# Patient Record
Sex: Female | Born: 1970 | Race: White | Hispanic: No | Marital: Single | Smoking: Never smoker
Health system: Southern US, Community
[De-identification: ages and names within clinical notes are randomized; demographics above are authoritative.]

## PROBLEM LIST (undated history)

## (undated) DIAGNOSIS — R079 Chest pain, unspecified: Secondary | ICD-10-CM

## (undated) DIAGNOSIS — M1990 Unspecified osteoarthritis, unspecified site: Secondary | ICD-10-CM

## (undated) DIAGNOSIS — D649 Anemia, unspecified: Secondary | ICD-10-CM

## (undated) DIAGNOSIS — R011 Cardiac murmur, unspecified: Secondary | ICD-10-CM

## (undated) HISTORY — PX: CHOLECYSTECTOMY: SHX55

## (undated) HISTORY — DX: Cardiac murmur, unspecified: R01.1

## (undated) HISTORY — DX: Chest pain, unspecified: R07.9

---

## 2008-11-10 ENCOUNTER — Emergency Department: Payer: Self-pay | Admitting: Emergency Medicine

## 2009-03-03 ENCOUNTER — Emergency Department: Payer: Self-pay | Admitting: Emergency Medicine

## 2010-03-12 ENCOUNTER — Observation Stay (HOSPITAL_COMMUNITY): Admission: EM | Admit: 2010-03-12 | Discharge: 2010-03-13 | Payer: Self-pay | Admitting: Emergency Medicine

## 2010-03-13 ENCOUNTER — Encounter (INDEPENDENT_AMBULATORY_CARE_PROVIDER_SITE_OTHER): Payer: Self-pay | Admitting: Emergency Medicine

## 2010-03-17 ENCOUNTER — Emergency Department (HOSPITAL_COMMUNITY): Admission: EM | Admit: 2010-03-17 | Discharge: 2010-03-18 | Payer: Self-pay | Admitting: Emergency Medicine

## 2010-05-16 ENCOUNTER — Emergency Department: Payer: Self-pay | Admitting: Emergency Medicine

## 2010-10-27 LAB — POCT CARDIAC MARKERS
Myoglobin, poc: 44.5 ng/mL (ref 12–200)
Troponin i, poc: <0.05 ng/mL (ref 0.00–0.09)

## 2010-10-28 LAB — HEPATIC FUNCTION PANEL
AST: 36 U/L (ref 0–37)
Albumin: 3.5 g/dL (ref 3.5–5.2)
Total Bilirubin: 0.4 mg/dL (ref 0.3–1.2)

## 2010-10-28 LAB — POCT CARDIAC MARKERS: Troponin i, poc: <0.05 ng/mL (ref 0.00–0.09)

## 2010-10-28 LAB — DIFFERENTIAL
Basophils Absolute: 0.1 10*3/uL (ref 0.0–0.1)
Basophils Relative: 1 % (ref 0–1)
Eosinophils Absolute: 0.6 10*3/uL (ref 0.0–0.7)
Lymphocytes Relative: 21 % (ref 12–46)
Lymphs Abs: 1.8 10*3/uL (ref 0.7–4.0)
Monocytes Relative: 8 % (ref 3–12)
Neutrophils Relative %: 63 % (ref 43–77)

## 2010-10-28 LAB — POCT I-STAT, CHEM 8
Hemoglobin: 13.6 g/dL (ref 12.0–15.0)
TCO2: 24 mmol/L (ref 0–100)

## 2010-10-28 LAB — RAPID URINE DRUG SCREEN, HOSP PERFORMED
Cocaine: NOT DETECTED
Opiates: NOT DETECTED
Tetrahydrocannabinol: NOT DETECTED

## 2010-10-28 LAB — D-DIMER, QUANTITATIVE: D-Dimer, Quant: 0.4 ug/mL-FEU (ref 0.00–0.48)

## 2010-10-28 LAB — CBC
MCH: 31.9 pg (ref 26.0–34.0)
MCHC: 33.4 g/dL (ref 30.0–36.0)
MCV: 95.5 fL (ref 78.0–100.0)
Platelets: 325 10*3/uL (ref 150–400)
RBC: 3.87 MIL/uL (ref 3.87–5.11)

## 2012-03-08 ENCOUNTER — Encounter (HOSPITAL_COMMUNITY): Payer: Self-pay

## 2012-03-08 ENCOUNTER — Emergency Department (HOSPITAL_COMMUNITY)
Admission: EM | Admit: 2012-03-08 | Discharge: 2012-03-08 | Disposition: A | Discharge status: Home / Self Care | Payer: Self-pay | Attending: Emergency Medicine | Admitting: Emergency Medicine

## 2012-03-08 DIAGNOSIS — M545 Low back pain, unspecified: Secondary | ICD-10-CM | POA: Insufficient documentation

## 2012-03-08 DIAGNOSIS — X58XXXA Exposure to other specified factors, initial encounter: Secondary | ICD-10-CM | POA: Insufficient documentation

## 2012-03-08 DIAGNOSIS — S335XXA Sprain of ligaments of lumbar spine, initial encounter: Secondary | ICD-10-CM | POA: Insufficient documentation

## 2012-03-08 DIAGNOSIS — Y92009 Unspecified place in unspecified non-institutional (private) residence as the place of occurrence of the external cause: Secondary | ICD-10-CM | POA: Insufficient documentation

## 2012-03-08 DIAGNOSIS — S39012A Strain of muscle, fascia and tendon of lower back, initial encounter: Secondary | ICD-10-CM

## 2012-03-08 MED ORDER — OXYCODONE-ACETAMINOPHEN 5-325 MG PO TABS
1.0000 | ORAL_TABLET | Freq: Once | ORAL | Status: AC
  Administered 2012-03-08: 1 via ORAL
  Filled 2012-03-08: qty 1

## 2012-03-08 MED ORDER — IBUPROFEN 800 MG PO TABS: 800.0000 mg | ORAL_TABLET | Freq: Three times a day (TID) | ORAL | Status: AC | PRN

## 2012-03-08 MED ORDER — OXYCODONE-ACETAMINOPHEN 5-325 MG PO TABS: 1.0000 | ORAL_TABLET | Freq: Four times a day (QID) | ORAL | Status: AC | PRN

## 2012-03-08 MED ORDER — CYCLOBENZAPRINE HCL 5 MG PO TABS: 5.0000 mg | ORAL_TABLET | Freq: Three times a day (TID) | ORAL | Status: DC | PRN

## 2012-03-08 MED ORDER — CYCLOBENZAPRINE HCL 10 MG PO TABS
10.0000 mg | ORAL_TABLET | Freq: Once | ORAL | Status: AC
  Administered 2012-03-08: 10 mg via ORAL
  Filled 2012-03-08: qty 1

## 2012-03-08 MED ORDER — IBUPROFEN 800 MG PO TABS
800.0000 mg | ORAL_TABLET | Freq: Once | ORAL | Status: AC
  Administered 2012-03-08: 800 mg via ORAL
  Filled 2012-03-08: qty 1

## 2012-03-08 NOTE — ED Provider Notes (Signed)
History     CSN: [id number]  Arrival date & time 03/08/12  1830   First MD Initiated Contact with Patient 03/08/12 1835      Chief Complaint  Patient presents with  . Back Pain    (Consider location/radiation/quality/duration/timing/severity/associated sxs/prior treatment) HPI Comments: Mallory Neal presents with acute  low back pain which has which started when she woke this morning.   Patient denies any new injury specifically,  But spent yesterday cleaning her house and doing laundry so was fairly active.  There is no radiation of pain.  There has been no weakness or numbness in the lower extremities and no urinary or bowel retention or incontinence.  Patient does not have a history of cancer or IVDU.  She has tried a generic tylenol labeled for "back and body" pain which has not been helpful.     The history is provided by the patient.    History reviewed. No pertinent past medical history.  Past Surgical History  Procedure Date  . Cholecystectomy     No family history on file.  History  Substance Use Topics  . Smoking status: Never Smoker   . Smokeless tobacco: Not on file  . Alcohol Use: Yes     occ    OB History    Grav Para Term Preterm Abortions TAB SAB Ect Mult Living                  Review of Systems  Constitutional: Negative for fever.  Respiratory: Negative for shortness of breath.   Cardiovascular: Negative for chest pain and leg swelling.  Gastrointestinal: Negative for abdominal pain, constipation and abdominal distention.  Genitourinary: Negative for dysuria, urgency, frequency, flank pain and difficulty urinating.  Musculoskeletal: Positive for back pain. Negative for joint swelling and gait problem.  Skin: Negative for rash.  Neurological: Negative for weakness and numbness.    Allergies  Review of patient's allergies indicates no known allergies.  Home Medications   Current Outpatient Rx  Name Route Sig Dispense Refill  .  CYCLOBENZAPRINE HCL 5 MG PO TABS Oral Take 1 tablet (5 mg total) by mouth 3 (three) times daily as needed for muscle spasms. 15 tablet 0  . IBUPROFEN 800 MG PO TABS Oral Take 1 tablet (800 mg total) by mouth every 8 (eight) hours as needed for pain. 15 tablet 0  . OXYCODONE-ACETAMINOPHEN 5-325 MG PO TABS Oral Take 1 tablet by mouth every 6 (six) hours as needed for pain. 20 tablet 0    BP 132/79  Pulse 82  Temp 98.3 F (36.8 C) (Oral)  Resp 20  Ht 5\' 6"  (1.676 m)  Wt 230 lb (104.327 kg)  BMI 37.12 kg/m2  SpO2 100%  LMP 02/11/2012  Physical Exam  Nursing note and vitals reviewed. Constitutional: She appears well-developed and well-nourished.  HENT:  Head: Normocephalic.  Eyes: Conjunctivae are normal.  Neck: Normal range of motion. Neck supple.  Cardiovascular: Normal rate and intact distal pulses.        Pedal pulses normal.  Pulmonary/Chest: Effort normal.  Abdominal: Soft. Bowel sounds are normal. She exhibits no distension and no mass.  Musculoskeletal: Normal range of motion. She exhibits no edema.       Lumbar back: She exhibits tenderness. She exhibits no swelling, no edema and no spasm.  Neurological: She is alert. She has normal strength. She displays no atrophy and no tremor. No sensory deficit. Gait normal.  Reflex Scores:      Patellar  reflexes are 2+ on the right side and 2+ on the left side.      Achilles reflexes are 2+ on the right side and 2+ on the left side.      No strength deficit noted in hip and knee flexor and extensor muscle groups.  Ankle flexion and extension intact.  Skin: Skin is warm and dry.  Psychiatric: She has a normal mood and affect.    ED Course  Procedures (including critical care time)  Labs Reviewed - No data to display No results found.   1. Lumbar strain       MDM  No neuro deficit on exam or by history to suggest emergent or surgical presentation.  Pt prescribed ibuprofen,  Flexeril and percocet.  Symptoms that should prompt  emergent recheck discussed.          Burgess Amor, [place] 03/08/12 1912

## 2012-03-08 NOTE — ED Notes (Signed)
Pt reports waking this am w/ severe low back pain, denies any n/v/d or fever, no urinary s/s, has taken 4 back and body with no relief.

## 2012-03-08 NOTE — ED Provider Notes (Signed)
Medical screening examination/treatment/procedure(s) were performed by non-physician practitioner and as supervising physician I was immediately available for consultation/collaboration.   Carleene Cooper III, MD 03/08/12 [phone]

## 2012-07-04 ENCOUNTER — Emergency Department (HOSPITAL_COMMUNITY)
Admission: EM | Admit: 2012-07-04 | Discharge: 2012-07-05 | Disposition: A | Discharge status: Home / Self Care | Payer: Self-pay | Attending: Emergency Medicine | Admitting: Emergency Medicine

## 2012-07-04 ENCOUNTER — Encounter (HOSPITAL_COMMUNITY): Payer: Self-pay | Admitting: *Deleted

## 2012-07-04 DIAGNOSIS — M549 Dorsalgia, unspecified: Secondary | ICD-10-CM

## 2012-07-04 DIAGNOSIS — M545 Low back pain, unspecified: Secondary | ICD-10-CM | POA: Insufficient documentation

## 2012-07-04 DIAGNOSIS — F1010 Alcohol abuse, uncomplicated: Secondary | ICD-10-CM | POA: Insufficient documentation

## 2012-07-04 LAB — URINALYSIS, ROUTINE W REFLEX MICROSCOPIC
Bilirubin Urine: NEGATIVE
Glucose, UA: NEGATIVE mg/dL
Hgb urine dipstick: NEGATIVE
Ketones, ur: NEGATIVE mg/dL
Nitrite: NEGATIVE
Protein, ur: NEGATIVE mg/dL
Specific Gravity, Urine: <1.005 — ABNORMAL LOW (ref 1.005–1.030)

## 2012-07-04 MED ORDER — CYCLOBENZAPRINE HCL 10 MG PO TABS
10.0000 mg | ORAL_TABLET | Freq: Once | ORAL | Status: AC
  Administered 2012-07-04: 10 mg via ORAL
  Filled 2012-07-04: qty 1

## 2012-07-04 MED ORDER — IBUPROFEN 800 MG PO TABS
800.0000 mg | ORAL_TABLET | Freq: Once | ORAL | Status: AC
  Administered 2012-07-04: 800 mg via ORAL
  Filled 2012-07-04: qty 1

## 2012-07-04 MED ORDER — ONDANSETRON HCL 4 MG/2ML IJ SOLN
4.0000 mg | Freq: Once | INTRAMUSCULAR | Status: AC
  Administered 2012-07-04: 4 mg via INTRAVENOUS
  Filled 2012-07-04: qty 2

## 2012-07-04 MED ORDER — HYDROMORPHONE HCL PF 1 MG/ML IJ SOLN
1.0000 mg | Freq: Once | INTRAMUSCULAR | Status: AC
  Administered 2012-07-04: 1 mg via INTRAVENOUS
  Filled 2012-07-04: qty 1

## 2012-07-04 NOTE — ED Provider Notes (Signed)
History   This chart was scribed for Sunnie Nielsen, MD by Gerlean Ren, ED Scribe. This patient was seen in room [facility]19[facility] and the patient's care was started at 11:33 PM    CSN: [id number]  Arrival date & time 07/04/12  2102   First MD Initiated Contact with Patient 07/04/12 2259      Chief Complaint  Patient presents with  . Flank Pain     The history is provided by the patient. No language interpreter was used.   Mallory Neal is a 41 y.o. female who presents to the Emergency Department complaining of constant, sharp, non-radiating moderate lower back pain with gradual onset at 3:00 PM that has been gradually worsening since.  Pt denies any unusual lifting, bending, or twisting tonight that may have caused pain. Pt denies abdominal pain, incontinence, weakness, numbness, fever, or dysuria.  Pt has no h/o chronic medical conditions.  Pt denies tobacco use but reports occasional alcohol use.   History reviewed. No pertinent past medical history.  Past Surgical History  Procedure Date  . Cholecystectomy     History reviewed. No pertinent family history.  History  Substance Use Topics  . Smoking status: Never Smoker   . Smokeless tobacco: Not on file  . Alcohol Use: Yes     Comment: occ    No OB history provided.   Review of Systems  Constitutional: Negative for fever.  Respiratory: Negative for shortness of breath.   Cardiovascular: Negative for chest pain.  Gastrointestinal: Negative for nausea, vomiting and abdominal pain.  Genitourinary: Negative for dysuria.  Musculoskeletal: Positive for back pain. Negative for gait problem.  Skin: Negative for rash.  Neurological: Negative for weakness and numbness.  Psychiatric/Behavioral: Negative for confusion.    Allergies  Codeine and Hydrocodone  Home Medications  No current outpatient prescriptions on file.  BP 142/89  Pulse 106  Temp 97.5 F (36.4 C) (Oral)  Resp 20  Ht 5\' 6"  (1.676 m)  Wt 249 lb (112.946 kg)   BMI 40.19 kg/m2  SpO2 98%  LMP 06/23/2012  Physical Exam  Nursing note and vitals reviewed. Constitutional: She is oriented to person, place, and time. She appears well-developed and well-nourished.  HENT:  Head: Normocephalic and atraumatic.  Eyes: Conjunctivae normal and EOM are normal. Pupils are equal, round, and reactive to light.  Neck: Normal range of motion. Neck supple.  Cardiovascular: Normal rate, regular rhythm and normal heart sounds.   Pulmonary/Chest: Effort normal and breath sounds normal.  Abdominal: Soft. Bowel sounds are normal.  Musculoskeletal: Normal range of motion.       Tender over left lower lumbar, no deformity, no rash. No lower extremity deficits: equal sensorium to light touch, strengths and DTRs.  Gait intact.  Neurological: She is alert and oriented to person, place, and time.  Skin: Skin is warm and dry.  Psychiatric: She has a normal mood and affect.    ED Course  Procedures (including critical care time) DIAGNOSTIC STUDIES: Oxygen Saturation is 98% on room air, normal by my interpretation.    COORDINATION OF CARE: 11:40 PM- Patient informed of clinical course, understands medical decision-making process, and agrees with plan.    Results for orders placed during the hospital encounter of 07/04/12  URINALYSIS, ROUTINE W REFLEX MICROSCOPIC      Component Value Range   Color, Urine YELLOW  YELLOW   APPearance CLEAR  CLEAR   Specific Gravity, Urine <1.005 (*) 1.005 - 1.030   pH 5.5  5.0 -  8.0   Glucose, UA NEGATIVE  NEGATIVE mg/dL   Hgb urine dipstick NEGATIVE  NEGATIVE   Bilirubin Urine NEGATIVE  NEGATIVE   Ketones, ur NEGATIVE  NEGATIVE mg/dL   Protein, ur NEGATIVE  NEGATIVE mg/dL   Urobilinogen, UA 0.2  0.0 - 1.0 mg/dL   Nitrite NEGATIVE  NEGATIVE   Leukocytes, UA NEGATIVE  NEGATIVE   No direct trauma or indication for imaging based on presentation and exam. Motrin Flexeril provided.  IV Dilaudid - patient states she is able to take  Percocet, does not have a serious hydrocodone allergy.  on recheck pain improved. Back pain precautions provided and verbalizes understood. Plan discharge home with prescriptions and close primary care followup.  MDM  Back pain likely musculoskeletal in origin. No direct trauma. Normal neuro exam without red flags. UA reviewed as above. IV narcotics and pain medications provided. Vital signs nursing notes reviewed.   I personally performed the services described in this documentation, which was scribed in my presence. The recorded information has been reviewed and is accurate.         Sunnie Nielsen, MD 07/05/12 [phone]

## 2012-07-04 NOTE — ED Notes (Signed)
Pain lt upper back, no known injury, cough, Increased pain with movement.

## 2012-07-05 MED ORDER — CYCLOBENZAPRINE HCL 10 MG PO TABS: 10.0000 mg | ORAL_TABLET | Freq: Three times a day (TID) | ORAL | Status: DC | PRN

## 2012-07-05 MED ORDER — IBUPROFEN 800 MG PO TABS: 800.0000 mg | ORAL_TABLET | Freq: Three times a day (TID) | ORAL | Status: DC

## 2012-07-05 MED ORDER — TRAMADOL HCL 50 MG PO TABS: 50.0000 mg | ORAL_TABLET | Freq: Four times a day (QID) | ORAL | Status: DC | PRN

## 2012-07-05 NOTE — ED Notes (Signed)
Applied ice pack to left side of back.

## 2012-12-14 ENCOUNTER — Emergency Department (HOSPITAL_COMMUNITY)
Admission: EM | Admit: 2012-12-14 | Discharge: 2012-12-14 | Disposition: A | Discharge status: Home / Self Care | Payer: Self-pay | Attending: Emergency Medicine | Admitting: Emergency Medicine

## 2012-12-14 ENCOUNTER — Encounter (HOSPITAL_COMMUNITY): Payer: Self-pay

## 2012-12-14 DIAGNOSIS — Z79899 Other long term (current) drug therapy: Secondary | ICD-10-CM | POA: Insufficient documentation

## 2012-12-14 DIAGNOSIS — IMO0001 Reserved for inherently not codable concepts without codable children: Secondary | ICD-10-CM | POA: Insufficient documentation

## 2012-12-14 DIAGNOSIS — R05 Cough: Secondary | ICD-10-CM | POA: Insufficient documentation

## 2012-12-14 DIAGNOSIS — J069 Acute upper respiratory infection, unspecified: Secondary | ICD-10-CM | POA: Insufficient documentation

## 2012-12-14 DIAGNOSIS — J40 Bronchitis, not specified as acute or chronic: Secondary | ICD-10-CM

## 2012-12-14 DIAGNOSIS — J209 Acute bronchitis, unspecified: Secondary | ICD-10-CM | POA: Insufficient documentation

## 2012-12-14 DIAGNOSIS — R059 Cough, unspecified: Secondary | ICD-10-CM | POA: Insufficient documentation

## 2012-12-14 DIAGNOSIS — R6889 Other general symptoms and signs: Secondary | ICD-10-CM | POA: Insufficient documentation

## 2012-12-14 MED ORDER — BENZONATATE 100 MG PO CAPS: 200.0000 mg | ORAL_CAPSULE | Freq: Three times a day (TID) | ORAL | Status: DC | PRN

## 2012-12-14 MED ORDER — AZITHROMYCIN 250 MG PO TABS
500.0000 mg | ORAL_TABLET | Freq: Once | ORAL | Status: AC
  Administered 2012-12-14: 500 mg via ORAL
  Filled 2012-12-14: qty 2

## 2012-12-14 MED ORDER — BENZONATATE 100 MG PO CAPS
200.0000 mg | ORAL_CAPSULE | Freq: Once | ORAL | Status: AC
  Administered 2012-12-14: 200 mg via ORAL
  Filled 2012-12-14: qty 2

## 2012-12-14 MED ORDER — AZITHROMYCIN 250 MG PO TABS: ORAL_TABLET | ORAL | Status: DC

## 2012-12-14 NOTE — ED Notes (Signed)
Pt reports being sick for 2 weeks, w/ uri.

## 2012-12-17 NOTE — ED Provider Notes (Signed)
History     CSN: [id number]  Arrival date & time 12/14/12  1639   First MD Initiated Contact with Patient 12/14/12 1703      Chief Complaint  Patient presents with  . URI    (Consider location/radiation/quality/duration/timing/severity/associated sxs/prior treatment) Patient is a 42 y.o. female presenting with URI. The history is provided by the patient.  URI Presenting symptoms: congestion, cough and rhinorrhea   Presenting symptoms: no ear pain, no facial pain, no fatigue, no fever and no sore throat   Congestion:    Location:  Nasal and chest   Interferes with sleep: no     Interferes with eating/drinking: no   Cough:    Cough characteristics:  Productive   Sputum characteristics:  Nondescript   Severity:  Moderate   Onset quality:  Gradual   Duration:  2 weeks   Timing:  Intermittent   Progression:  Unchanged   Chronicity:  New Severity:  Mild Onset quality:  Gradual Timing:  Intermittent Progression:  Unchanged Chronicity:  New Relieved by:  Nothing Worsened by:  Certain positions Ineffective treatments:  OTC medications Associated symptoms: myalgias, sinus pain and sneezing   Associated symptoms: no arthralgias, no headaches, no neck pain, no swollen glands and no wheezing   Risk factors: no chronic cardiac disease, no chronic respiratory disease, no diabetes mellitus and no recent travel     History reviewed. No pertinent past medical history.  Past Surgical History  Procedure Laterality Date  . Cholecystectomy      No family history on file.  History  Substance Use Topics  . Smoking status: Never Smoker   . Smokeless tobacco: Not on file  . Alcohol Use: Yes     Comment: occ    OB History   Grav Para Term Preterm Abortions TAB SAB Ect Mult Living                  Review of Systems  Constitutional: Negative for fever, chills, activity change, appetite change and fatigue.  HENT: Positive for congestion, rhinorrhea, sneezing and sinus pressure.  Negative for ear pain, sore throat, facial swelling, trouble swallowing, neck pain and neck stiffness.   Eyes: Negative for visual disturbance.  Respiratory: Positive for cough. Negative for shortness of breath, wheezing and stridor.   Cardiovascular: Negative for chest pain.  Gastrointestinal: Negative for nausea, vomiting and abdominal pain.  Genitourinary: Negative for dysuria, frequency, hematuria and difficulty urinating.  Musculoskeletal: Positive for myalgias. Negative for arthralgias.  Skin: Negative.   Neurological: Negative for dizziness, weakness, numbness and headaches.  Hematological: Negative for adenopathy.  Psychiatric/Behavioral: Negative for confusion.  All other systems reviewed and are negative.    Allergies  Codeine and Hydrocodone  Home Medications   Current Outpatient Rx  Name  Route  Sig  Dispense  Refill  . diphenhydrAMINE (BENADRYL) 25 MG tablet   Oral   Take 50 mg by mouth once as needed for itching or allergies.         Marland Kitchen guaiFENesin-dextromethorphan (ROBITUSSIN DM) 100-10 MG/5ML syrup   Oral   Take 5 mLs by mouth 3 (three) times daily as needed for cough.         Marland Kitchen ibuprofen (ADVIL,MOTRIN) 800 MG tablet   Oral   Take 1 tablet (800 mg total) by mouth 3 (three) times daily.   21 tablet   0   . azithromycin (ZITHROMAX Z-PAK) 250 MG tablet      Take two tablets on day one, then one  tab qd days 2-5   6 tablet   0   . benzonatate (TESSALON) 100 MG capsule   Oral   Take 2 capsules (200 mg total) by mouth 3 (three) times daily as needed for cough.   21 capsule   0   . cyclobenzaprine (FLEXERIL) 10 MG tablet   Oral   Take 1 tablet (10 mg total) by mouth 3 (three) times daily as needed for muscle spasms.   30 tablet   0   . traMADol (ULTRAM) 50 MG tablet   Oral   Take 1 tablet (50 mg total) by mouth every 6 (six) hours as needed for pain.   15 tablet   0     BP 131/77  Pulse 98  Temp(Src) 99.3 F (37.4 C) (Oral)  Resp 22  Ht 5'  6" (1.676 m)  Wt 240 lb (108.863 kg)  BMI 38.76 kg/m2  SpO2 100%  LMP 11/30/2012  Physical Exam  Nursing note and vitals reviewed. Constitutional: She is oriented to person, place, and time. She appears well-developed and well-nourished. No distress.  HENT:  Head: Normocephalic and atraumatic. No trismus in the jaw.  Right Ear: Tympanic membrane and ear canal normal.  Left Ear: Tympanic membrane and ear canal normal.  Nose: Mucosal edema and rhinorrhea present.  Mouth/Throat: Uvula is midline and mucous membranes are normal. No edematous. Posterior oropharyngeal erythema present. No oropharyngeal exudate, posterior oropharyngeal edema or tonsillar abscesses.  Eyes: Conjunctivae and EOM are normal. Pupils are equal, round, and reactive to light.  Neck: Normal range of motion and phonation normal. Neck supple. No Brudzinski's sign and no Kernig's sign noted.  Cardiovascular: Normal rate, regular rhythm, normal heart sounds and intact distal pulses.   No murmur heard. Pulmonary/Chest: Effort normal and breath sounds normal. She has no wheezes. She has no rales.  Coarse lung sounds bilaterally that improve with cough.  No rales or wheezing  Abdominal: Soft. Bowel sounds are normal. She exhibits no distension. There is no tenderness. There is no rebound and no guarding.  Musculoskeletal: She exhibits no edema.  Lymphadenopathy:    She has no cervical adenopathy.  Neurological: She is alert and oriented to person, place, and time. She exhibits normal muscle tone. Coordination normal.  Skin: Skin is warm and dry.    ED Course  Procedures (including critical care time)  Labs Reviewed - No data to display No results found.   1. Bronchitis       MDM    Patient is alert, VSS.  Non toxic appearing.  No meningeal signs.  Agrees to fluids, rest and close f/u with her PMD  The patient appears reasonably screened and/or stabilized for discharge and I doubt any other medical condition or  other [facility] requiring further screening, evaluation, or treatment in the ED at this time prior to discharge.       Zakyra Kukuk L. Deziah Renwick, PA-C 12/17/12 2257

## 2012-12-18 NOTE — ED Provider Notes (Signed)
Medical screening examination/treatment/procedure(s) were performed by non-physician practitioner and as supervising physician I was immediately available for consultation/collaboration.  Donnetta Hutching, MD 12/18/12 [phone]

## 2013-04-22 ENCOUNTER — Emergency Department (HOSPITAL_COMMUNITY): Payer: Self-pay

## 2013-04-22 ENCOUNTER — Encounter (HOSPITAL_COMMUNITY): Payer: Self-pay | Admitting: *Deleted

## 2013-04-22 ENCOUNTER — Emergency Department (HOSPITAL_COMMUNITY)
Admission: EM | Admit: 2013-04-22 | Discharge: 2013-04-22 | Disposition: A | Discharge status: Home / Self Care | Payer: Self-pay | Attending: Emergency Medicine | Admitting: Emergency Medicine

## 2013-04-22 DIAGNOSIS — S60222A Contusion of left hand, initial encounter: Secondary | ICD-10-CM

## 2013-04-22 DIAGNOSIS — Y929 Unspecified place or not applicable: Secondary | ICD-10-CM | POA: Insufficient documentation

## 2013-04-22 DIAGNOSIS — Z79899 Other long term (current) drug therapy: Secondary | ICD-10-CM | POA: Insufficient documentation

## 2013-04-22 DIAGNOSIS — Y9389 Activity, other specified: Secondary | ICD-10-CM | POA: Insufficient documentation

## 2013-04-22 DIAGNOSIS — S60229A Contusion of unspecified hand, initial encounter: Secondary | ICD-10-CM | POA: Insufficient documentation

## 2013-04-22 DIAGNOSIS — W2209XA Striking against other stationary object, initial encounter: Secondary | ICD-10-CM | POA: Insufficient documentation

## 2013-04-22 MED ORDER — TRAMADOL HCL 50 MG PO TABS
50.0000 mg | ORAL_TABLET | Freq: Once | ORAL | Status: AC
  Administered 2013-04-22: 50 mg via ORAL
  Filled 2013-04-22: qty 1

## 2013-04-22 MED ORDER — IBUPROFEN 800 MG PO TABS: 800.0000 mg | ORAL_TABLET | Freq: Three times a day (TID) | ORAL | Status: DC

## 2013-04-22 MED ORDER — TRAMADOL HCL 50 MG PO TABS: 50.0000 mg | ORAL_TABLET | Freq: Four times a day (QID) | ORAL | Status: DC | PRN

## 2013-04-22 NOTE — ED Notes (Signed)
Pt punched a wall and now c/o left hand pain.

## 2013-04-22 NOTE — ED Provider Notes (Signed)
CSN: [id number]     Arrival date & time 04/22/13  2123 History   First MD Initiated Contact with Patient 04/22/13 2154     Chief Complaint  Patient presents with  . Hand Pain   (Consider location/radiation/quality/duration/timing/severity/associated sxs/prior Treatment) Patient is a 42 y.o. female presenting with hand injury. The history is provided by the patient.  Hand Injury Location:  Hand Time since incident: just prior to ED arrival. Injury: yes   Mechanism of injury comment:  Direct blow.  pt became upset and punched a wall. Hand location:  L hand Pain details:    Quality:  Aching and throbbing   Radiates to:  Does not radiate   Severity:  Moderate   Onset quality:  Sudden   Timing:  Constant   Progression:  Unchanged Chronicity:  New Handedness:  Left-handed Dislocation: no   Foreign body present:  No foreign bodies Prior injury to area:  No Relieved by:  Nothing Worsened by:  Movement Ineffective treatments:  None tried Associated symptoms: swelling   Associated symptoms: no decreased range of motion, no fever, no neck pain, no numbness, no stiffness and no tingling     History reviewed. No pertinent past medical history. Past Surgical History  Procedure Laterality Date  . Cholecystectomy     History reviewed. No pertinent family history. History  Substance Use Topics  . Smoking status: Never Smoker   . Smokeless tobacco: Not on file  . Alcohol Use: Yes     Comment: occ   OB History   Grav Para Term Preterm Abortions TAB SAB Ect Mult Living                 Review of Systems  Constitutional: Negative for fever and chills.  HENT: Negative for neck pain.   Musculoskeletal: Positive for joint swelling and arthralgias. Negative for stiffness.  Skin: Negative for color change and wound.  All other systems reviewed and are negative.    Allergies  Codeine  Home Medications   Current Outpatient Rx  Name  Route  Sig  Dispense  Refill  . azithromycin  (ZITHROMAX Z-PAK) 250 MG tablet      Take two tablets on day one, then one tab qd days 2-5   6 tablet   0   . benzonatate (TESSALON) 100 MG capsule   Oral   Take 2 capsules (200 mg total) by mouth 3 (three) times daily as needed for cough.   21 capsule   0   . cyclobenzaprine (FLEXERIL) 10 MG tablet   Oral   Take 1 tablet (10 mg total) by mouth 3 (three) times daily as needed for muscle spasms.   30 tablet   0   . diphenhydrAMINE (BENADRYL) 25 MG tablet   Oral   Take 50 mg by mouth once as needed for itching or allergies.         Marland Kitchen guaiFENesin-dextromethorphan (ROBITUSSIN DM) 100-10 MG/5ML syrup   Oral   Take 5 mLs by mouth 3 (three) times daily as needed for cough.         Marland Kitchen ibuprofen (ADVIL,MOTRIN) 800 MG tablet   Oral   Take 1 tablet (800 mg total) by mouth 3 (three) times daily.   21 tablet   0   . traMADol (ULTRAM) 50 MG tablet   Oral   Take 1 tablet (50 mg total) by mouth every 6 (six) hours as needed for pain.   15 tablet   0  BP 119/81  Pulse 93  Temp(Src) 98 F (36.7 C) (Oral)  Resp 20  Ht 5\' 6"  (1.676 m)  Wt 230 lb (104.327 kg)  BMI 37.14 kg/m2  SpO2 95%  LMP 04/07/2013 Physical Exam  Nursing note and vitals reviewed. Constitutional: She is oriented to person, place, and time. She appears well-developed and well-nourished. No distress.  HENT:  Head: Normocephalic and atraumatic.  Cardiovascular: Normal rate, regular rhythm, normal heart sounds and intact distal pulses.   No murmur heard. Pulmonary/Chest: Effort normal and breath sounds normal. No respiratory distress.  Musculoskeletal: She exhibits edema and tenderness.       Left hand: She exhibits tenderness and swelling. She exhibits no bony tenderness, normal two-point discrimination, normal capillary refill, no deformity and no laceration. Normal sensation noted. Normal strength noted.       Hands: ttp of the metacarpals of the left hand  Localized STS and bruising present.  Radial  pulse is brisk, distal sensation intact.  CR< 2 sec.  No bony deformity.  Patient has full ROM. Wrist is NT  Neurological: She is alert and oriented to person, place, and time. She exhibits normal muscle tone. Coordination normal.  Skin: Skin is warm and dry.    ED Course  Procedures (including critical care time) Labs Review Labs Reviewed - No data to display Imaging Review Dg Hand Complete Left  04/22/2013   *RADIOLOGY REPORT*  Clinical Data: Pain post trauma  LEFT HAND - COMPLETE 3+ VIEW  Comparison: None.  Findings: Frontal, oblique, and lateral views were obtained.  There is no fracture or dislocation.  Joint spaces appear intact.  No erosive change.  IMPRESSION: No abnormality noted.   Original Report Authenticated By: Bretta Bang, M.D.    MDM    [place] narcotic database reviewed  bulky dressing applied.  Pain improved, remains NV intact  Pt agrees to rest, ice , elevate hand.  Referral given for ortho f/u.  Pt agrees to care plan  Jori Thrall L. Trisha Mangle, PA-C 04/23/13 2219

## 2013-04-28 NOTE — ED Provider Notes (Signed)
Medical screening examination/treatment/procedure(s) were performed by non-physician practitioner and as supervising physician I was immediately available for consultation/collaboration.    Dedee Liss J. Zeppelin Commisso, MD 04/28/13 1525 

## 2013-09-08 ENCOUNTER — Emergency Department (HOSPITAL_COMMUNITY)
Admission: EM | Admit: 2013-09-08 | Discharge: 2013-09-08 | Disposition: A | Discharge status: Home / Self Care | Payer: Self-pay | Attending: Emergency Medicine | Admitting: Emergency Medicine

## 2013-09-08 ENCOUNTER — Encounter (HOSPITAL_COMMUNITY): Payer: Self-pay | Admitting: Emergency Medicine

## 2013-09-08 DIAGNOSIS — Z791 Long term (current) use of non-steroidal anti-inflammatories (NSAID): Secondary | ICD-10-CM | POA: Insufficient documentation

## 2013-09-08 DIAGNOSIS — J029 Acute pharyngitis, unspecified: Secondary | ICD-10-CM | POA: Insufficient documentation

## 2013-09-08 LAB — RAPID STREP SCREEN (MED CTR MEBANE ONLY): STREPTOCOCCUS, GROUP A SCREEN (DIRECT): NEGATIVE

## 2013-09-08 MED ORDER — BENZONATATE 100 MG PO CAPS: 100.0000 mg | ORAL_CAPSULE | Freq: Three times a day (TID) | ORAL | Status: DC

## 2013-09-08 MED ORDER — HYDROCODONE-ACETAMINOPHEN 7.5-325 MG/15ML PO SOLN
15.0000 mL | Freq: Once | ORAL | Status: AC
  Administered 2013-09-08: 15 mL via ORAL
  Filled 2013-09-08: qty 15

## 2013-09-08 MED ORDER — HYDROCODONE-ACETAMINOPHEN 7.5-325 MG/15ML PO SOLN: 15.0000 mL | Freq: Three times a day (TID) | ORAL | Status: DC | PRN

## 2013-09-08 NOTE — ED Notes (Signed)
Dry hacking cough for several days not responding to OTC Dayquil, Nyqyil, Ibuprofen and dextromethorphan.  C/o pain in throat.  Denies nausea, vomiting or diarrhea, denies body aches.  States she did have a fever 1/24

## 2013-09-08 NOTE — ED Provider Notes (Signed)
CSN: [id number]     Arrival date & time 09/08/13  0435 History   First MD Initiated Contact with Patient 09/08/13 [id number]     Chief Complaint  Patient presents with  . Sore Throat  . Cough   (Consider location/radiation/quality/duration/timing/severity/associated sxs/prior Treatment) HPI Comments: Pt is 43 y/o female with no sig PMH- presents with sore throat - has been present for several days - gradual in onset - gradually worening, persistent, associated with mild dry cough and fevers but no vomiting, diarrhea, rash or swelling.  She has had a sick coworker with similar sx.  Patient is a 43 y.o. female presenting with pharyngitis and cough. The history is provided by the patient and a relative.  Sore Throat  Cough   History reviewed. No pertinent past medical history. Past Surgical History  Procedure Laterality Date  . Cholecystectomy     No family history on file. History  Substance Use Topics  . Smoking status: Never Smoker   . Smokeless tobacco: Not on file  . Alcohol Use: Yes     Comment: occ   OB History   Grav Para Term Preterm Abortions TAB SAB Ect Mult Living                 Review of Systems  Respiratory: Positive for cough.   All other systems reviewed and are negative.    Allergies  Codeine  Home Medications   Current Outpatient Rx  Name  Route  Sig  Dispense  Refill  . guaiFENesin-dextromethorphan (ROBITUSSIN DM) 100-10 MG/5ML syrup   Oral   Take 5 mLs by mouth 3 (three) times daily as needed for cough.         Marland Kitchen ibuprofen (ADVIL,MOTRIN) 800 MG tablet   Oral   Take 1 tablet (800 mg total) by mouth 3 (three) times daily.   21 tablet   0   . azithromycin (ZITHROMAX Z-PAK) 250 MG tablet      Take two tablets on day one, then one tab qd days 2-5   6 tablet   0   . benzonatate (TESSALON) 100 MG capsule   Oral   Take 2 capsules (200 mg total) by mouth 3 (three) times daily as needed for cough.   21 capsule   0   . cyclobenzaprine  (FLEXERIL) 10 MG tablet   Oral   Take 1 tablet (10 mg total) by mouth 3 (three) times daily as needed for muscle spasms.   30 tablet   0   . diphenhydrAMINE (BENADRYL) 25 MG tablet   Oral   Take 50 mg by mouth once as needed for itching or allergies.         Marland Kitchen HYDROcodone-acetaminophen (HYCET) 7.5-325 mg/15 ml solution   Oral   Take 15 mLs by mouth every 8 (eight) hours as needed for moderate pain.   120 mL   0   . ibuprofen (ADVIL,MOTRIN) 800 MG tablet   Oral   Take 1 tablet (800 mg total) by mouth 3 (three) times daily.   21 tablet   0   . traMADol (ULTRAM) 50 MG tablet   Oral   Take 1 tablet (50 mg total) by mouth every 6 (six) hours as needed for pain.   15 tablet   0   . traMADol (ULTRAM) 50 MG tablet   Oral   Take 1 tablet (50 mg total) by mouth every 6 (six) hours as needed for pain.   15 tablet   0  BP 136/99  Pulse 107  Temp(Src) 98.5 F (36.9 C) (Oral)  Resp 20  Ht 5\' 6"  (4.825 m)  Wt 230 lb (104.327 kg)  BMI 37.14 kg/m2  SpO2 98%  LMP 08/14/2013 Physical Exam  Nursing note and vitals reviewed. Constitutional: She appears well-developed and well-nourished. No distress.  HENT:  Head: Normocephalic and atraumatic.  Mouth/Throat: No oropharyngeal exudate.  Pharynx erythematous but no exudate, asymetry, or hypertrophy.  MMM  Eyes: Conjunctivae and EOM are normal. Pupils are equal, round, and reactive to light. Right eye exhibits no discharge. Left eye exhibits no discharge. No scleral icterus.  Neck: Normal range of motion. Neck supple. No JVD present. No thyromegaly present.  Supple, no LAD  Cardiovascular: Normal rate, regular rhythm, normal heart sounds and intact distal pulses.  Exam reveals no gallop and no friction rub.   No murmur heard. Pulmonary/Chest: Effort normal and breath sounds normal. No respiratory distress. She has no wheezes. She has no rales.  Abdominal: Soft. Bowel sounds are normal. She exhibits no distension and no mass. There  is no tenderness.  Musculoskeletal: Normal range of motion. She exhibits no edema and no tenderness.  Lymphadenopathy:    She has no cervical adenopathy.  Neurological: She is alert. Coordination normal.  Skin: Skin is warm and dry. No rash noted. No erythema.  Psychiatric: She has a normal mood and affect. Her behavior is normal.    ED Course  Procedures (including critical care time) Labs Review Labs Reviewed  RAPID STREP SCREEN  CULTURE, GROUP A STREP   Imaging Review No results found.  EKG Interpretation   None       MDM   1. Pharyngitis    Pt has signs of pharyngitis - no exudate, or LAD - fevers have been as high as 103 at home, taking multiple OTC meds without relief.  Check for strep, pain meds ordered and given, - states can take hydrocodone safely.  TEsting neg for strep - pt stable -  Meds given in ED:  Medications  HYDROcodone-acetaminophen (HYCET) 7.5-325 mg/15 ml solution 15 mL (15 mLs Oral Given 09/08/13 0459)    New Prescriptions   HYDROCODONE-ACETAMINOPHEN (HYCET) 7.5-325 MG/15 ML SOLUTION    Take 15 mLs by mouth every 8 (eight) hours as needed for moderate pain.      Johnna Acosta, MD 09/08/13 0530

## 2013-09-08 NOTE — Discharge Instructions (Signed)
Your strep test was normal - no strep throat - read attached instructions  Liquid vicodin for sore throat if motrin or naprosyn doesn't help.  Please call your doctor for a followup appointment within 24-48 hours. When you talk to your doctor please let them know that you were seen in the emergency department and have them acquire all of your records so that they can discuss the findings with you and formulate a treatment plan to fully care for your new and ongoing problems.

## 2013-09-10 LAB — CULTURE, GROUP A STREP

## 2013-09-12 ENCOUNTER — Encounter: Payer: Self-pay | Admitting: *Deleted

## 2013-09-12 DIAGNOSIS — R079 Chest pain, unspecified: Secondary | ICD-10-CM | POA: Insufficient documentation

## 2013-09-12 DIAGNOSIS — R011 Cardiac murmur, unspecified: Secondary | ICD-10-CM | POA: Insufficient documentation

## 2014-02-27 ENCOUNTER — Encounter (HOSPITAL_COMMUNITY): Payer: Self-pay | Admitting: Emergency Medicine

## 2014-02-27 ENCOUNTER — Emergency Department (HOSPITAL_COMMUNITY)
Admission: EM | Admit: 2014-02-27 | Discharge: 2014-02-27 | Disposition: A | Discharge status: Home / Self Care | Payer: Self-pay | Attending: Emergency Medicine | Admitting: Emergency Medicine

## 2014-02-27 DIAGNOSIS — L259 Unspecified contact dermatitis, unspecified cause: Secondary | ICD-10-CM | POA: Insufficient documentation

## 2014-02-27 DIAGNOSIS — R011 Cardiac murmur, unspecified: Secondary | ICD-10-CM | POA: Insufficient documentation

## 2014-02-27 MED ORDER — FAMOTIDINE 20 MG PO TABS
40.0000 mg | ORAL_TABLET | Freq: Once | ORAL | Status: AC
  Administered 2014-02-27: 40 mg via ORAL
  Filled 2014-02-27: qty 2

## 2014-02-27 MED ORDER — FAMOTIDINE 20 MG PO TABS: 20.0000 mg | ORAL_TABLET | Freq: Two times a day (BID) | ORAL | Status: DC

## 2014-02-27 MED ORDER — PREDNISONE 5 MG PO TABS: ORAL_TABLET | ORAL | Status: DC

## 2014-02-27 MED ORDER — DIPHENHYDRAMINE HCL 25 MG PO CAPS
50.0000 mg | ORAL_CAPSULE | Freq: Once | ORAL | Status: AC
  Administered 2014-02-27: 50 mg via ORAL
  Filled 2014-02-27: qty 2

## 2014-02-27 MED ORDER — DEXAMETHASONE SODIUM PHOSPHATE 10 MG/ML IJ SOLN
10.0000 mg | Freq: Once | INTRAMUSCULAR | Status: AC
  Administered 2014-02-27: 10 mg via INTRAMUSCULAR
  Filled 2014-02-27: qty 1

## 2014-02-27 MED ORDER — PREDNISONE 10 MG PO TABS: ORAL_TABLET | ORAL | Status: DC

## 2014-02-27 NOTE — ED Provider Notes (Signed)
CSN: [id number]     Arrival date & time 02/27/14  1941 History   First MD Initiated Contact with Patient 02/27/14 2023     Chief Complaint  Patient presents with  . Insect Bite     (Consider location/radiation/quality/duration/timing/severity/associated sxs/prior Treatment) The history is provided by the patient.   Mallory Neal is a 43 y.o. female presenting with a rash which started this am when she first woke.  The lesions are red and itchy,  Starting on her left posterior thigh,  Has since developed several new spots on her anterior upper legs and and her abdomen.  She denies any new exposures to chemicals,  Body products or soaps. She went walking yesterday through a mowed path of grass,  Her walking partner took 4 ticks off himself since this walk but she has found no ticks.  She celebrated a wedding last night,  Was intoxicated but denies any outdoor exposures.  She has multiple pets at home,  2 dogs, 2 cats and a hamster, no fleas.  No other household members with similar rash.  She denies recent travel, also denies fevers, chills, cough, wheezing or swelling.  She has taken benadryl with partial  Relief of itch.    Past Medical History  Diagnosis Date  . Chest pain   . Heart murmur    Past Surgical History  Procedure Laterality Date  . Cholecystectomy     History reviewed. No pertinent family history. History  Substance Use Topics  . Smoking status: Never Smoker   . Smokeless tobacco: Not on file  . Alcohol Use: Yes     Comment: occ   OB History   Grav Para Term Preterm Abortions TAB SAB Ect Mult Living                 Review of Systems  Constitutional: Negative for fever and chills.  Respiratory: Negative for shortness of breath and wheezing.   Skin: Positive for rash.  Neurological: Negative for numbness.      Allergies  Codeine  Home Medications   Prior to Admission medications   Medication Sig Start Date End Date Taking? Authorizing Provider   diphenhydrAMINE (BENADRYL) 25 MG tablet Take 50 mg by mouth once as needed for itching or allergies.    Historical Provider, MD  famotidine (PEPCID) 20 MG tablet Take 1 tablet (20 mg total) by mouth 2 (two) times daily. 02/27/14   Evalee Jefferson, PA-C  predniSONE (DELTASONE) 10 MG tablet 6, 5, 4, 3, 2 then 1 tablet by mouth daily for 6 days total. 02/27/14   Evalee Jefferson, PA-C   BP 141/84  Pulse 99  Temp(Src) 98.6 F (37 C) (Oral)  Resp 18  SpO2 95%  LMP 02/10/2014 Physical Exam  Constitutional: She appears well-developed and well-nourished. No distress.  HENT:  Head: Normocephalic.  Neck: Neck supple.  Cardiovascular: Normal rate.   Pulmonary/Chest: Effort normal. She has no wheezes.  Musculoskeletal: Normal range of motion. She exhibits no edema.  Skin: Rash noted.  Multiple erythematous lesions on legs and abdomen with central excoriations resembling insect, possibly mosquito bites.  No induration, no fluctuance.  Largest lesion posterior thigh,  2 cm ring of erythema surrounding slightly raised central bite.    ED Course  Procedures (including critical care time) Labs Review Labs Reviewed - No data to display  Imaging Review No results found.   EKG Interpretation None      MDM   Final diagnoses:  Contact dermatitis and other  eczema, due to unspecified cause    Suspect insect bites as source.  Patient was encouraged to continue with Benadryl, may increase to 2 capsules every 6 hours.  Pepcid and prednisone prescribed first dose of each one given here prior to discharge home.    Evalee Jefferson, PA-C 02/27/14 2110

## 2014-02-27 NOTE — Discharge Instructions (Signed)
Contact Dermatitis Contact dermatitis is a reaction to certain substances that touch the skin. Contact dermatitis can be either irritant contact dermatitis or allergic contact dermatitis. Irritant contact dermatitis does not require previous exposure to the substance for a reaction to occur.Allergic contact dermatitis only occurs if you have been exposed to the substance before. Upon a repeat exposure, your body reacts to the substance.  CAUSES  Many substances can cause contact dermatitis. Irritant dermatitis is most commonly caused by repeated exposure to mildly irritating substances, such as:  Makeup.  Soaps.  Detergents.  Bleaches.  Acids.  Metal salts, such as nickel. Allergic contact dermatitis is most commonly caused by exposure to:  Poisonous plants.  Chemicals (deodorants, shampoos).  Jewelry.  Latex.  Neomycin in triple antibiotic cream.  Preservatives in products, including clothing. SYMPTOMS  The area of skin that is exposed may develop:  Dryness or flaking.  Redness.  Cracks.  Itching.  Pain or a burning sensation.  Blisters. With allergic contact dermatitis, there may also be swelling in areas such as the eyelids, mouth, or genitals.  DIAGNOSIS  Your caregiver can usually tell what the problem is by doing a physical exam. In cases where the cause is uncertain and an allergic contact dermatitis is suspected, a patch skin test may be performed to help determine the cause of your dermatitis. TREATMENT Treatment includes protecting the skin from further contact with the irritating substance by avoiding that substance if possible. Barrier creams, powders, and gloves may be helpful. Your caregiver may also recommend:  Steroid creams or ointments applied 2 times daily. For best results, soak the rash area in cool water for 20 minutes. Then apply the medicine. Cover the area with a plastic wrap. You can store the steroid cream in the refrigerator for a "chilly"  effect on your rash. That may decrease itching. Oral steroid medicines may be needed in more severe cases.  Antibiotics or antibacterial ointments if a skin infection is present.  Antihistamine lotion or an antihistamine taken by mouth to ease itching.  Lubricants to keep moisture in your skin.  Burow's solution to reduce redness and soreness or to dry a weeping rash. Mix one packet or tablet of solution in 2 cups cool water. Dip a clean washcloth in the mixture, wring it out a bit, and put it on the affected area. Leave the cloth in place for 30 minutes. Do this as often as possible throughout the day.  Taking several cornstarch or baking soda baths daily if the area is too large to cover with a washcloth. Harsh chemicals, such as alkalis or acids, can cause skin damage that is like a burn. You should flush your skin for 15 to 20 minutes with cold water after such an exposure. You should also seek immediate medical care after exposure. Bandages (dressings), antibiotics, and pain medicine may be needed for severely irritated skin.  HOME CARE INSTRUCTIONS  Avoid the substance that caused your reaction.  Keep the area of skin that is affected away from hot water, soap, sunlight, chemicals, acidic substances, or anything else that would irritate your skin.  Do not scratch the rash. Scratching may cause the rash to become infected.  You may take cool baths to help stop the itching.  Only take over-the-counter or prescription medicines as directed by your caregiver.  See your caregiver for follow-up care as directed to make sure your skin is healing properly. SEEK MEDICAL CARE IF:   Your condition is not better after 3  days of treatment.  You seem to be getting worse.  You see signs of infection such as swelling, tenderness, redness, soreness, or warmth in the affected area.  You have any problems related to your medicines. Document Released: 07/27/2000 Document Revised: 10/22/2011  Document Reviewed: 01/02/2011 [facility] Patient Information 2015 [place], [place]. This information is not intended to replace advice given to you by your health care provider. Make sure you discuss any questions you have with your health care provider.   Take your next dose of prednisone tomorrow evening.  Take your next dose of Pepcid and Benadryl tomorrow morning.  You may take 2 Benadryl capsules in one is not relieving your itching.  Cool soaks or ice packs can also help with itching.  Get rechecked if your symptoms are not improving or you develop any new symptoms.

## 2014-02-27 NOTE — ED Notes (Signed)
Pt has multile red raised areas all over her body which seems to have a small dark center.

## 2014-02-27 NOTE — ED Notes (Signed)
Patient with no complaints at this time. Respirations even and unlabored. Skin warm/dry. Discharge instructions reviewed with patient at this time. Patient given opportunity to voice concerns/ask questions. Patient discharged at this time and left Emergency Department with steady gait.   

## 2014-02-28 NOTE — ED Provider Notes (Signed)
Medical screening examination/treatment/procedure(s) were performed by non-physician practitioner and as supervising physician I was immediately available for consultation/collaboration.   EKG Interpretation None        Orpah Greek, MD 02/28/14 1500

## 2015-05-08 ENCOUNTER — Emergency Department (HOSPITAL_COMMUNITY): Payer: Self-pay

## 2015-05-08 ENCOUNTER — Emergency Department (HOSPITAL_COMMUNITY)
Admission: EM | Admit: 2015-05-08 | Discharge: 2015-05-09 | Disposition: A | Discharge status: Home / Self Care | Payer: Self-pay | Attending: Emergency Medicine | Admitting: Emergency Medicine

## 2015-05-08 DIAGNOSIS — M19071 Primary osteoarthritis, right ankle and foot: Secondary | ICD-10-CM | POA: Insufficient documentation

## 2015-05-08 DIAGNOSIS — R011 Cardiac murmur, unspecified: Secondary | ICD-10-CM | POA: Insufficient documentation

## 2015-05-08 DIAGNOSIS — R079 Chest pain, unspecified: Secondary | ICD-10-CM | POA: Insufficient documentation

## 2015-05-08 DIAGNOSIS — Z79899 Other long term (current) drug therapy: Secondary | ICD-10-CM | POA: Insufficient documentation

## 2015-05-08 MED ORDER — KETOROLAC TROMETHAMINE 10 MG PO TABS
10.0000 mg | ORAL_TABLET | Freq: Once | ORAL | Status: AC
  Administered 2015-05-08: 10 mg via ORAL
  Filled 2015-05-08: qty 1

## 2015-05-08 MED ORDER — TRAMADOL HCL 50 MG PO TABS
100.0000 mg | ORAL_TABLET | Freq: Once | ORAL | Status: AC
  Administered 2015-05-08: 100 mg via ORAL
  Filled 2015-05-08: qty 2

## 2015-05-08 MED ORDER — PROMETHAZINE HCL 12.5 MG PO TABS
12.5000 mg | ORAL_TABLET | Freq: Once | ORAL | Status: AC
  Administered 2015-05-08: 12.5 mg via ORAL
  Filled 2015-05-08: qty 1

## 2015-05-08 NOTE — ED Provider Notes (Signed)
CSN: [id number]     Arrival date & time 05/08/15  2215 History   First MD Initiated Contact with Patient 05/08/15 2237     Chief Complaint  Patient presents with  . Foot Pain     (Consider location/radiation/quality/duration/timing/severity/associated sxs/prior Treatment) HPI Comments: Patient is a 44 year old female who presents to the emergency department with a complaint of right foot pain. The patient states that for the past 2 weeks she has been having increasing problems with her foot. She states that she does a lot of standing at work. And at the end of her designated time, she can hardly walk from her work area to her vehicle. She has even more problem than from the vehicle to the inside of her home. She states that this problem seems to be getting worse especially over the last few days. She's not had known injury to the foot. His been no previous operations involving the right foot. She has tried ibuprofen, but states this office very little improvement.  The history is provided by the patient.    Past Medical History  Diagnosis Date  . Chest pain   . Heart murmur    Past Surgical History  Procedure Laterality Date  . Cholecystectomy     No family history on file. Social History  Substance Use Topics  . Smoking status: Never Smoker   . Smokeless tobacco: Not on file  . Alcohol Use: Yes     Comment: occ   OB History    No data available     Review of Systems  Cardiovascular: Positive for chest pain.  Musculoskeletal: Positive for arthralgias.  All other systems reviewed and are negative.     Allergies  Codeine  Home Medications   Prior to Admission medications   Medication Sig Start Date End Date Taking? Authorizing Provider  ibuprofen (ADVIL,MOTRIN) 200 MG tablet Take 400 mg by mouth every 6 (six) hours as needed for mild pain.   Yes Historical Provider, MD  famotidine (PEPCID) 20 MG tablet Take 1 tablet (20 mg total) by mouth 2 (two) times daily. 02/27/14    Evalee Jefferson, PA-C  predniSONE (DELTASONE) 5 MG tablet 60 mg to 10 mg 6 day taper. 02/27/14   Evalee Jefferson, PA-C   BP 116/65 mmHg  Pulse 86  Temp(Src) 97.8 F (36.6 C) (Oral)  Resp 18  Ht 5\' 6"  (1.676 m)  Wt 193 lb (87.544 kg)  BMI 31.17 kg/m2  SpO2 100%  LMP 05/01/2015 Physical Exam  Constitutional: She is oriented to person, place, and time. She appears well-developed and well-nourished.  Non-toxic appearance.  HENT:  Head: Normocephalic.  Right Ear: Tympanic membrane and external ear normal.  Left Ear: Tympanic membrane and external ear normal.  Eyes: EOM and lids are normal. Pupils are equal, round, and reactive to light.  Neck: Normal range of motion. Neck supple. Carotid bruit is not present.  Cardiovascular: Normal rate, regular rhythm, normal heart sounds, intact distal pulses and normal pulses.   Pulmonary/Chest: Breath sounds normal. No respiratory distress.  Abdominal: Soft. Bowel sounds are normal. There is no tenderness. There is no guarding.  Musculoskeletal: Normal range of motion.       Right foot: There is tenderness. There is no swelling, normal capillary refill, no deformity and no laceration.       Feet:  Lymphadenopathy:       Head (right side): No submandibular adenopathy present.       Head (left side): No submandibular adenopathy present.  She has no cervical adenopathy.  Neurological: She is alert and oriented to person, place, and time. She has normal strength. No cranial nerve deficit or sensory deficit.  Skin: Skin is warm and dry.  Psychiatric: She has a normal mood and affect. Her speech is normal.  Nursing note and vitals reviewed.   ED Course  Procedures (including critical care time) Labs Review Labs Reviewed - No data to display  Imaging Review No results found. I have personally reviewed and evaluated these images and lab results as part of my medical decision-making.   EKG Interpretation None      MDM  Vital signs are well  within normal limits. X-ray of the right foot is negative for fracture, dislocation, or foreign body. It does reveal some degenerative changes present.  The patient was given the results of the x-ray, as well as the examination. The patient is referred to Dr. Paulla Dolly (podiatry) or member of his team. Prescription for diclofenac 2 times daily, and Ultram every 6 hours have been given to the patient for assistance with her discomfort.    Final diagnoses:  None    **I have reviewed nursing notes, vital signs, and all appropriate lab and imaging results for this patient.Lily Kocher, PA-C 05/09/15 0112  Nat Christen, MD 05/09/15 [phone]

## 2015-05-08 NOTE — ED Notes (Signed)
Pt c/o right foot pain x 2 weeks; pt denies any kind of injury

## 2015-05-09 MED ORDER — TRAMADOL HCL 50 MG PO TABS: 50.0000 mg | ORAL_TABLET | Freq: Four times a day (QID) | ORAL | Status: DC | PRN

## 2015-05-09 MED ORDER — DICLOFENAC SODIUM 75 MG PO TBEC: 75.0000 mg | DELAYED_RELEASE_TABLET | Freq: Two times a day (BID) | ORAL | Status: DC

## 2015-05-09 NOTE — Discharge Instructions (Signed)
Your x-ray is negative for silent fracture, or dislocation. There are some arthritis changes involving you Osteoarthritis Osteoarthritis is a disease that causes soreness and inflammation of a joint. It occurs when the cartilage at the affected joint wears down. Cartilage acts as a cushion, covering the ends of bones where they meet to form a joint. Osteoarthritis is the most common form of arthritis. It often occurs in older people. The joints affected most often by this condition include those in the:  Ends of the fingers.  Thumbs.  Neck.  Lower back.  Knees.  Hips. CAUSES  Over time, the cartilage that covers the ends of bones begins to wear away. This causes bone to rub on bone, producing pain and stiffness in the affected joints.  RISK FACTORS Certain factors can increase your chances of having osteoarthritis, including:  Older age.  Excessive body weight.  Overuse of joints.  Previous joint injury. SIGNS AND SYMPTOMS   Pain, swelling, and stiffness in the joint.  Over time, the joint may lose its normal shape.  Small deposits of bone (osteophytes) may grow on the edges of the joint.  Bits of bone or cartilage can break off and float inside the joint space. This may cause more pain and damage. DIAGNOSIS  Your health care provider will do a physical exam and ask about your symptoms. Various tests may be ordered, such as:  X-rays of the affected joint.  An MRI scan.  Blood tests to rule out other types of arthritis.  Joint fluid tests. This involves using a needle to draw fluid from the joint and examining the fluid under a microscope. TREATMENT  Goals of treatment are to control pain and improve joint function. Treatment plans may include:  A prescribed exercise program that allows for rest and joint relief.  A weight control plan.  Pain relief techniques, such as:  Properly applied heat and cold.  Electric pulses delivered to nerve endings under the skin  (transcutaneous electrical nerve stimulation [TENS]).  Massage.  Certain nutritional supplements.  Medicines to control pain, such as:  Acetaminophen.  Nonsteroidal anti-inflammatory drugs (NSAIDs), such as naproxen.  Narcotic or central-acting agents, such as tramadol.  Corticosteroids. These can be given orally or as an injection.  Surgery to reposition the bones and relieve pain (osteotomy) or to remove loose pieces of bone and cartilage. Joint replacement may be needed in advanced states of osteoarthritis. HOME CARE INSTRUCTIONS   Take medicines only as directed by your health care provider.  Maintain a healthy weight. Follow your health care provider's instructions for weight control. This may include dietary instructions.  Exercise as directed. Your health care provider can recommend specific types of exercise. These may include:  Strengthening exercises. These are done to strengthen the muscles that support joints affected by arthritis. They can be performed with weights or with exercise bands to add resistance.  Aerobic activities. These are exercises, such as brisk walking or low-impact aerobics, that get your heart pumping.  Range-of-motion activities. These keep your joints limber.  Balance and agility exercises. These help you maintain daily living skills.  Rest your affected joints as directed by your health care provider.  Keep all follow-up visits as directed by your health care provider. SEEK MEDICAL CARE IF:   Your skin turns red.  You develop a rash in addition to your joint pain.  You have worsening joint pain.  You have a fever along with joint or muscle aches. SEEK IMMEDIATE MEDICAL CARE IF:  You have a significant loss of weight or appetite.  You have night sweats. [place] of Arthritis and Musculoskeletal and Skin Diseases: www.niams.SouthExposed.es  [employer] on Aging: http://kim-miller.com/  American College  of Rheumatology: www.rheumatology.org Document Released: 07/30/2005 Document Revised: 12/14/2013 Document Reviewed: 04/06/2013 [facility] Patient Information 2015 [place], [place]. This information is not intended to replace advice given to you by your health care provider. Make sure you discuss any questions you have with your health care provider. r foot. Please see the podiatrist listed above, or the podiatry specialist of your choice concerning the pain in your little toe area and right foot. Please use diclofenac 2 times daily with food. May use Ultram every 6 hours if needed for pain. This medication may cause drowsiness, please use with caution.

## 2015-05-09 NOTE — ED Notes (Signed)
Pt states that her pain is the same as when she came in to er, refused offer of wheelchair for assistance, states "I am walking"

## 2015-08-16 ENCOUNTER — Encounter (HOSPITAL_COMMUNITY): Payer: Self-pay | Admitting: Emergency Medicine

## 2015-08-16 ENCOUNTER — Emergency Department (HOSPITAL_COMMUNITY): Payer: Self-pay

## 2015-08-16 ENCOUNTER — Emergency Department (HOSPITAL_COMMUNITY)
Admission: EM | Admit: 2015-08-16 | Discharge: 2015-08-17 | Disposition: A | Discharge status: Home / Self Care | Payer: Self-pay | Attending: Emergency Medicine | Admitting: Emergency Medicine

## 2015-08-16 DIAGNOSIS — M940 Chondrocostal junction syndrome [Tietze]: Secondary | ICD-10-CM | POA: Insufficient documentation

## 2015-08-16 DIAGNOSIS — R011 Cardiac murmur, unspecified: Secondary | ICD-10-CM | POA: Insufficient documentation

## 2015-08-16 DIAGNOSIS — Z7982 Long term (current) use of aspirin: Secondary | ICD-10-CM | POA: Insufficient documentation

## 2015-08-16 DIAGNOSIS — R112 Nausea with vomiting, unspecified: Secondary | ICD-10-CM | POA: Insufficient documentation

## 2015-08-16 LAB — BASIC METABOLIC PANEL
ANION GAP: 9 (ref 5–15)
BUN: 8 mg/dL (ref 6–20)
CALCIUM: 9 mg/dL (ref 8.9–10.3)
CO2: 27 mmol/L (ref 22–32)
Chloride: 105 mmol/L (ref 101–111)
Creatinine, Ser: 0.68 mg/dL (ref 0.44–1.00)
GFR calc Af Amer: >60 mL/min (ref >60)
GFR calc non Af Amer: >60 mL/min (ref >60)
GLUCOSE: 116 mg/dL — AB (ref 65–99)
Potassium: 3.6 mmol/L (ref 3.5–5.1)
Sodium: 141 mmol/L (ref 135–145)

## 2015-08-16 LAB — CBC
HEMATOCRIT: 37.1 % (ref 36.0–46.0)
HEMOGLOBIN: 12.2 g/dL (ref 12.0–15.0)
MCH: 31 pg (ref 26.0–34.0)
MCHC: 32.9 g/dL (ref 30.0–36.0)
MCV: 94.4 fL (ref 78.0–100.0)
Platelets: 302 10*3/uL (ref 150–400)
RBC: 3.93 MIL/uL (ref 3.87–5.11)
RDW: 15.5 % (ref 11.5–15.5)
WBC: 6.6 10*3/uL (ref 4.0–10.5)

## 2015-08-16 LAB — HEPATIC FUNCTION PANEL
ALK PHOS: 68 U/L (ref 38–126)
ALT: 15 U/L (ref 14–54)
AST: 21 U/L (ref 15–41)
Albumin: 4.1 g/dL (ref 3.5–5.0)
BILIRUBIN TOTAL: 0.1 mg/dL — AB (ref 0.3–1.2)
Total Protein: 8 g/dL (ref 6.5–8.1)

## 2015-08-16 LAB — ETHANOL: Alcohol, Ethyl (B): 113 mg/dL — ABNORMAL HIGH (ref <5)

## 2015-08-16 MED ORDER — KETOROLAC TROMETHAMINE 30 MG/ML IJ SOLN
30.0000 mg | Freq: Once | INTRAMUSCULAR | Status: AC
  Administered 2015-08-16: 30 mg via INTRAVENOUS
  Filled 2015-08-16: qty 1

## 2015-08-16 MED ORDER — ONDANSETRON HCL 4 MG/2ML IJ SOLN
4.0000 mg | Freq: Once | INTRAMUSCULAR | Status: AC
  Administered 2015-08-16: 4 mg via INTRAVENOUS
  Filled 2015-08-16: qty 2

## 2015-08-16 NOTE — ED Notes (Signed)
Right sided chest pain that radiates down right arm with vomiting and dizziness

## 2015-08-16 NOTE — ED Provider Notes (Signed)
CSN: [id number]     Arrival date & time 08/16/15  2242 History  By signing my name below, I, Mallory Neal, attest that this documentation has been prepared under the direction and in the presence of Rolland Porter, MD at 2310. Electronically Signed: Hansel Neal, ED Scribe. 08/16/2015. 11:31 PM.    Chief Complaint  Patient presents with  . Chest Pain   The history is provided by the patient. No language interpreter was used.    HPI Comments: RAELEY Neal is a 45 y.o. female with h/o heart murmur, cholecystectomy who presents to the Emergency Department complaining of an episode of moderate, sudden onset generalized weakness, dorsal neck pain and occipital HA, lasting 30-40 minutes onset this morning at 10AM while standing up at a friend's home. Pt notes that these symptoms resolved on their own and she went to bed at 5PM. Pt then woke up at 8:30PM with right-sided, aching CP with radiation to the right arm. Pt states she was also nauseated with the CP and had emesis. She states she took 81 mg ASA with no relief. Pt states no relieving or worsening factors. 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 She states that she is still experiencing the same CP and nausea. FHx of Afib from mother, MI and CABG from maternal grandmother, CABG from paternal grandmother. No FHx of MI from mother. No daily medications. She denies SOB, diaphoresis.   PCP- none  Past Medical History  Diagnosis Date  . Chest pain   . Heart murmur    Past Surgical History  Procedure Laterality Date  . Cholecystectomy     History reviewed. No pertinent family history. Social History  Substance Use Topics  . Smoking status: Never Smoker   . Smokeless tobacco: None  . Alcohol Use: Yes     Comment: occ   Employed Drinks a 12 pack a day however has only had two16 ounces of beer today    OB History    No data available     Review of Systems  Constitutional:  Negative for diaphoresis.  Respiratory: Negative for shortness of breath.   Cardiovascular: Positive for chest pain.  Gastrointestinal: Positive for nausea and vomiting.  Musculoskeletal: Positive for neck pain ( resolved).  Neurological: Positive for weakness ( resolved) and headaches ( resolved).  All other systems reviewed and are negative.  Allergies  Codeine  Home Medications   Prior to Admission medications   Medication Sig Start Date End Date Taking? Authorizing Provider  aspirin EC 81 MG tablet Take 81 mg by mouth once.   Yes Historical Provider, MD  diclofenac (VOLTAREN) 75 MG EC tablet Take 1 tablet (75 mg total) by mouth 2 (two) times daily. Patient not taking: Reported on 08/16/2015 05/09/15   Lily Kocher, PA-C  naproxen (NAPROSYN) 500 MG tablet Take 1 po BID with food prn pain 08/17/15   Rolland Porter, MD  ondansetron (ZOFRAN) 4 MG tablet Take 1 tablet (4 mg total) by mouth every 8 (eight) hours as needed for nausea or vomiting. 08/17/15   Rolland Porter, MD   BP 156/85 mmHg  Pulse 91  Temp(Src) 97.6 F (36.4 C) (Oral)  Resp 20  Ht 5\' 6"  (1.676 m)  Wt 200 lb (90.719 kg)  BMI 32.30 kg/m2  SpO2 99%  LMP 08/15/2015  Vital signs normal   Physical Exam  Constitutional: She is oriented to person, place, and time. She appears  well-developed and well-nourished.  Non-toxic appearance. She does not appear ill. No distress.  HENT:  Head: Normocephalic and atraumatic.  Right Ear: External ear normal.  Left Ear: External ear normal.  Nose: Nose normal. No mucosal edema or rhinorrhea.  Mouth/Throat: Oropharynx is clear and moist and mucous membranes are normal. No dental abscesses or uvula swelling.  Eyes: Conjunctivae and EOM are normal. Pupils are equal, round, and reactive to light.  Neck: Normal range of motion and full passive range of motion without pain. Neck supple.  Cardiovascular: Normal rate, regular rhythm and normal heart sounds.  Exam reveals no gallop and no friction rub.    No murmur heard. Pulmonary/Chest: Effort normal and breath sounds normal. No respiratory distress. She has no wheezes. She has no rhonchi. She has no rales. She exhibits tenderness. She exhibits no crepitus.    Tender over the right lower costochondyl junctions that reproducers her complaints of pain.   Abdominal: Soft. Normal appearance and bowel sounds are normal. She exhibits no distension. There is no tenderness. There is no rebound and no guarding.  Musculoskeletal: Normal range of motion. She exhibits no edema or tenderness.  Moves all extremities well.   Neurological: She is alert and oriented to person, place, and time. She has normal strength. No cranial nerve deficit.  Skin: Skin is warm, dry and intact. No rash noted. No erythema. No pallor.  Psychiatric: She has a normal mood and affect. Her speech is normal and behavior is normal. Her mood appears not anxious.  Nursing note and vitals reviewed.   ED Course  Procedures (including critical care time)  Medications  ondansetron (ZOFRAN) injection 4 mg (4 mg Intravenous Given 08/16/15 2332)  ketorolac (TORADOL) 30 MG/ML injection 30 mg (30 mg Intravenous Given 08/16/15 2332)  ondansetron (ZOFRAN) injection 4 mg (4 mg Intravenous Given 08/17/15 0151)    DIAGNOSTIC STUDIES: Oxygen Saturation is 99% on RA, normal by my interpretation.    COORDINATION OF CARE: 11:24 PM Discussed treatment plan with pt at bedside and pt agreed to plan. Patient was given Toradol for her costochondritis type pain and Zofran for her nausea.  Patient continued to have nausea and her Zofran had to be repeated.  Patient had dealt her troponins done in the ED. At time of discharge patient states her chest pain is gone, she also states her nausea is gone. We discussed her test results which were all normal. He was discharged with anti-inflammatory and anti-emetics.   Labs Review Results for orders placed or performed during the hospital encounter of [id number]   Basic metabolic panel  Result Value Ref Range   Sodium 141 135 - 145 mmol/L   Potassium 3.6 3.5 - 5.1 mmol/L   Chloride 105 101 - 111 mmol/L   CO2 27 22 - 32 mmol/L   Glucose, Bld 116 (H) 65 - 99 mg/dL   BUN 8 6 - 20 mg/dL   Creatinine, Ser 0.68 0.44 - 1.00 mg/dL   Calcium 9.0 8.9 - 10.3 mg/dL   GFR calc non Af Amer >60 >60 mL/min   GFR calc Af Amer >60 >60 mL/min   Anion gap 9 5 - 15  CBC  Result Value Ref Range   WBC 6.6 4.0 - 10.5 K/uL   RBC 3.93 3.87 - 5.11 MIL/uL   Hemoglobin 12.2 12.0 - 15.0 g/dL   HCT 37.1 36.0 - 46.0 %   MCV 94.4 78.0 - 100.0 fL   MCH 31.0 26.0 - 34.0 pg  MCHC 32.9 30.0 - 36.0 g/dL   RDW 15.5 11.5 - 15.5 %   Platelets 302 150 - 400 K/uL  Hepatic function panel  Result Value Ref Range   Total Protein 8.0 6.5 - 8.1 g/dL   Albumin 4.1 3.5 - 5.0 g/dL   AST 21 15 - 41 U/L   ALT 15 14 - 54 U/L   Alkaline Phosphatase 68 38 - 126 U/L   Total Bilirubin 0.1 (L) 0.3 - 1.2 mg/dL   Bilirubin, Direct <0.1 (L) 0.1 - 0.5 mg/dL   Indirect Bilirubin NOT CALCULATED 0.3 - 0.9 mg/dL  Ethanol  Result Value Ref Range   Alcohol, Ethyl (B) 113 (H) <5 mg/dL  Troponin I  Result Value Ref Range   Troponin I <0.03 <0.031 ng/mL  Troponin I  Result Value Ref Range   Troponin I <0.03 <0.031 ng/mL   Laboratory interpretation all normal including delta troponin   Imaging Review Dg Chest 2 View  08/17/2015  CLINICAL DATA:  Right-sided chest pain, onset this evening. Pain radiates down right arm. Vomiting and dizziness. EXAM: CHEST  2 VIEW COMPARISON:  05/16/2010 FINDINGS: The cardiomediastinal contours are normal. The lungs are clear. Pulmonary vasculature is normal. No consolidation, pleural effusion, or pneumothorax. No acute osseous abnormalities are seen. IMPRESSION: No acute pulmonary process. Electronically Signed   By: Jeb Levering M.D.   On: 08/17/2015 00:00   I have personally reviewed and evaluated these images and lab results as part of my medical  decision-making.   EKG Interpretation   Date/Time:  Tuesday August 16 2015 22:55:43 EST Ventricular Rate:  92 PR Interval:  148 QRS Duration: 90 QT Interval:  340 QTC Calculation: 421 R Axis:   81 Text Interpretation:  Sinus rhythm Baseline wander in lead(s) I II aVR V6  No significant change since last tracing 16 May 2010 Confirmed by [facility]   MD-I, Birdell Frasier ([id number]) on 08/16/2015 11:02:55 PM      MDM   Final diagnoses:  Costochondritis, acute  Nausea and vomiting, vomiting of unspecified type   New Prescriptions   NAPROXEN (NAPROSYN) 500 MG TABLET    Take 1 po BID with food prn pain   ONDANSETRON (ZOFRAN) 4 MG TABLET    Take 1 tablet (4 mg total) by mouth every 8 (eight) hours as needed for nausea or vomiting.    Plan discharge  Rolland Porter, MD, FACEP   I personally performed the services described in this documentation, which was scribed in my presence. The recorded information has been reviewed and considered.  Rolland Porter, MD, Barbette Or, MD 08/17/15 [phone]

## 2015-08-17 LAB — TROPONIN I
Troponin I: <0.03 ng/mL (ref <0.031)
Troponin I: <0.03 ng/mL (ref <0.031)

## 2015-08-17 MED ORDER — ONDANSETRON HCL 4 MG PO TABS: 4.0000 mg | ORAL_TABLET | Freq: Three times a day (TID) | ORAL | Status: DC | PRN

## 2015-08-17 MED ORDER — ONDANSETRON HCL 4 MG/2ML IJ SOLN
4.0000 mg | Freq: Once | INTRAMUSCULAR | Status: AC
  Administered 2015-08-17: 4 mg via INTRAVENOUS
  Filled 2015-08-17: qty 2

## 2015-08-17 MED ORDER — NAPROXEN 500 MG PO TABS: ORAL_TABLET | ORAL | Status: DC

## 2015-08-17 NOTE — Discharge Instructions (Signed)
Use ice and heat on the painful area. Take the naproxen for pain. Use the zofran for nausea or vomiting. Drink plenty of fluids. Try to stop drinking alcohol.  Recheck if you get a cough, feel short of breath, get a fever, or seem worse.      Costochondritis Costochondritis, sometimes called Tietze syndrome, is a swelling and irritation (inflammation) of the tissue (cartilage) that connects your ribs with your breastbone (sternum). It causes pain in the chest and rib area. Costochondritis usually goes away on its own over time. It can take up to 6 weeks or longer to get better, especially if you are unable to limit your activities. CAUSES  Some cases of costochondritis have no known cause. Possible causes include:  Injury (trauma).  Exercise or activity such as lifting.  Severe coughing. SIGNS AND SYMPTOMS  Pain and tenderness in the chest and rib area.  Pain that gets worse when coughing or taking deep breaths.  Pain that gets worse with specific movements. DIAGNOSIS  Your health care provider will do a physical exam and ask about your symptoms. Chest X-rays or other tests may be done to rule out other problems. TREATMENT  Costochondritis usually goes away on its own over time. Your health care provider may prescribe medicine to help relieve pain. HOME CARE INSTRUCTIONS   Avoid exhausting physical activity. Try not to strain your ribs during normal activity. This would include any activities using chest, abdominal, and side muscles, especially if heavy weights are used.  Apply ice to the affected area for the first 2 days after the pain begins.  Put ice in a plastic bag.  Place a towel between your skin and the bag.  Leave the ice on for 20 minutes, 2-3 times a day.  Only take over-the-counter or prescription medicines as directed by your health care provider. SEEK MEDICAL CARE IF:  You have redness or swelling at the rib joints. These are signs of infection.  Your pain  does not go away despite rest or medicine. SEEK IMMEDIATE MEDICAL CARE IF:   Your pain increases or you are very uncomfortable.  You have shortness of breath or difficulty breathing.  You cough up blood.  You have worse chest pains, sweating, or vomiting.  You have a fever or persistent symptoms for more than 2-3 days.  You have a fever and your symptoms suddenly get worse. MAKE SURE YOU:   Understand these instructions.  Will watch your condition.  Will get help right away if you are not doing well or get worse.   This information is not intended to replace advice given to you by your health care provider. Make sure you discuss any questions you have with your health care provider.   Document Released: 05/09/2005 Document Revised: 05/20/2013 Document Reviewed: 03/03/2013 Elsevier Interactive Patient Education [employer].

## 2015-08-17 NOTE — ED Notes (Signed)
Pt states understanding of care given and follow up instructions 

## 2016-08-20 ENCOUNTER — Emergency Department (HOSPITAL_COMMUNITY)
Admission: EM | Admit: 2016-08-20 | Discharge: 2016-08-20 | Disposition: A | Discharge status: Home / Self Care | Payer: BLUE CROSS/BLUE SHIELD | Attending: Emergency Medicine | Admitting: Emergency Medicine

## 2016-08-20 ENCOUNTER — Emergency Department (HOSPITAL_COMMUNITY): Payer: BLUE CROSS/BLUE SHIELD

## 2016-08-20 DIAGNOSIS — Z7982 Long term (current) use of aspirin: Secondary | ICD-10-CM | POA: Insufficient documentation

## 2016-08-20 DIAGNOSIS — R109 Unspecified abdominal pain: Secondary | ICD-10-CM | POA: Insufficient documentation

## 2016-08-20 DIAGNOSIS — R1011 Right upper quadrant pain: Secondary | ICD-10-CM | POA: Diagnosis present

## 2016-08-20 LAB — URINALYSIS, ROUTINE W REFLEX MICROSCOPIC
BILIRUBIN URINE: NEGATIVE
Glucose, UA: NEGATIVE mg/dL
HGB URINE DIPSTICK: NEGATIVE
Ketones, ur: NEGATIVE mg/dL
Nitrite: NEGATIVE
Protein, ur: NEGATIVE mg/dL
SPECIFIC GRAVITY, URINE: 1.008 (ref 1.005–1.030)
pH: 5 (ref 5.0–8.0)

## 2016-08-20 LAB — PREGNANCY, URINE: PREG TEST UR: NEGATIVE

## 2016-08-20 LAB — COMPREHENSIVE METABOLIC PANEL
ALK PHOS: 75 U/L (ref 38–126)
ALT: 18 U/L (ref 14–54)
ANION GAP: 6 (ref 5–15)
AST: 23 U/L (ref 15–41)
Albumin: 3.8 g/dL (ref 3.5–5.0)
BILIRUBIN TOTAL: 0.1 mg/dL — AB (ref 0.3–1.2)
BUN: 9 mg/dL (ref 6–20)
CALCIUM: 8.8 mg/dL — AB (ref 8.9–10.3)
CO2: 30 mmol/L (ref 22–32)
CREATININE: 0.56 mg/dL (ref 0.44–1.00)
Chloride: 97 mmol/L — ABNORMAL LOW (ref 101–111)
Glucose, Bld: 90 mg/dL (ref 65–99)
Potassium: 3.5 mmol/L (ref 3.5–5.1)
SODIUM: 133 mmol/L — AB (ref 135–145)
TOTAL PROTEIN: 7.9 g/dL (ref 6.5–8.1)

## 2016-08-20 LAB — CBC
HCT: 29.6 % — ABNORMAL LOW (ref 36.0–46.0)
HEMOGLOBIN: 9.2 g/dL — AB (ref 12.0–15.0)
MCH: 25.6 pg — ABNORMAL LOW (ref 26.0–34.0)
MCHC: 31.1 g/dL (ref 30.0–36.0)
MCV: 82.5 fL (ref 78.0–100.0)
PLATELETS: 518 10*3/uL — AB (ref 150–400)
RBC: 3.59 MIL/uL — AB (ref 3.87–5.11)
RDW: 17.6 % — ABNORMAL HIGH (ref 11.5–15.5)
WBC: 9.4 10*3/uL (ref 4.0–10.5)

## 2016-08-20 LAB — LIPASE, BLOOD: Lipase: 23 U/L (ref 11–51)

## 2016-08-20 MED ORDER — TRAMADOL HCL 50 MG PO TABS: 50.0000 mg | ORAL_TABLET | Freq: Four times a day (QID) | ORAL | 0 refills | Status: DC | PRN

## 2016-08-20 MED ORDER — OXYCODONE-ACETAMINOPHEN 5-325 MG PO TABS
1.0000 | ORAL_TABLET | Freq: Once | ORAL | Status: AC
  Administered 2016-08-20: 1 via ORAL
  Filled 2016-08-20: qty 1

## 2016-08-20 NOTE — Discharge Instructions (Signed)
Follow-up with your doctor or return here for any worsening symptoms such as fever, vomiting or increasing pain

## 2016-08-20 NOTE — ED Triage Notes (Signed)
Patient complaining of upper right quadrant abdominal pain since Saturday. Denies vomiting / diarrhea.

## 2016-08-24 NOTE — ED Provider Notes (Signed)
[facility] DEPT Provider Note   CSN: [id number] Arrival date & time: 08/20/16  1722     History   Chief Complaint Chief Complaint  Patient presents with  . Abdominal Pain    HPI Mallory Neal is a 46 y.o. female.  HPI   Mallory Neal is a 46 y.o. female who presents to the Emergency Department complaining of right upper abdominal pain for 2 days.  She describes an aching pain that is worse with movement, resolves at rest.  She denies fever, vomiting, diarrhea, chest pain, shortness of breath or loss of appetite.  She has tried OTC analgesics without relief.  She reports pain onset while moving and lifting objects at her work.     Past Medical History:  Diagnosis Date  . Chest pain   . Heart murmur     Patient Active Problem List   Diagnosis Date Noted  . Chest pain   . Heart murmur     Past Surgical History:  Procedure Laterality Date  . CHOLECYSTECTOMY      OB History    No data available       Home Medications    Prior to Admission medications   Medication Sig Start Date End Date Taking? Authorizing Provider  aspirin EC 81 MG tablet Take 81 mg by mouth once.    Historical Provider, MD  diclofenac (VOLTAREN) 75 MG EC tablet Take 1 tablet (75 mg total) by mouth 2 (two) times daily. Patient not taking: Reported on 08/16/2015 05/09/15   Lily Kocher, PA-C  naproxen (NAPROSYN) 500 MG tablet Take 1 po BID with food prn pain 08/17/15   Rolland Porter, MD  ondansetron (ZOFRAN) 4 MG tablet Take 1 tablet (4 mg total) by mouth every 8 (eight) hours as needed for nausea or vomiting. 08/17/15   Rolland Porter, MD  traMADol (ULTRAM) 50 MG tablet Take 1 tablet (50 mg total) by mouth every 6 (six) hours as needed. 08/20/16   Earlene Bjelland, PA-C    Family History No family history on file.  Social History Social History  Substance Use Topics  . Smoking status: Never Smoker  . Smokeless tobacco: Not on file  . Alcohol use Yes     Comment: occ     Allergies    Codeine   Review of Systems Review of Systems  Constitutional: Negative for appetite change, chills and fever.  Respiratory: Negative for shortness of breath.   Cardiovascular: Negative for chest pain.  Gastrointestinal: Positive for abdominal pain. Negative for blood in stool, constipation, diarrhea, nausea and vomiting.  Genitourinary: Negative for decreased urine volume, difficulty urinating, dysuria and flank pain.  Musculoskeletal: Negative for back pain.  Skin: Negative for color change and rash.  Neurological: Negative for dizziness, weakness and numbness.  Hematological: Negative for adenopathy.  All other systems reviewed and are negative.    Physical Exam Updated Vital Signs BP 144/74   Pulse 73   Temp 98.1 F (36.7 C) (Oral)   Resp 18   Ht 5\' 6"  (1.676 m)   Wt 102.1 kg   SpO2 99%   BMI 36.32 kg/m   Physical Exam  Constitutional: She is oriented to person, place, and time. She appears well-developed and well-nourished. No distress.  HENT:  Head: Normocephalic and atraumatic.  Mouth/Throat: Oropharynx is clear and moist.  Cardiovascular: Normal rate, regular rhythm and intact distal pulses.   No murmur heard. Pulmonary/Chest: Effort normal and breath sounds normal. No respiratory distress.  Abdominal: Soft. Bowel  sounds are normal. She exhibits no distension and no mass. There is no tenderness. There is no rebound and no guarding.  Musculoskeletal: Normal range of motion. She exhibits no edema.  Neurological: She is alert and oriented to person, place, and time. She exhibits normal muscle tone. Coordination normal.  Skin: Skin is warm and dry.  Psychiatric: She has a normal mood and affect.  Nursing note and vitals reviewed.    ED Treatments / Results  Labs (all labs ordered are listed, but only abnormal results are displayed) Labs Reviewed  COMPREHENSIVE METABOLIC PANEL - Abnormal; Notable for the following:       Result Value   Sodium 133 (*)     Chloride 97 (*)    Calcium 8.8 (*)    Total Bilirubin 0.1 (*)    All other components within normal limits  CBC - Abnormal; Notable for the following:    RBC 3.59 (*)    Hemoglobin 9.2 (*)    HCT 29.6 (*)    MCH 25.6 (*)    RDW 17.6 (*)    Platelets 518 (*)    All other components within normal limits  URINALYSIS, ROUTINE W REFLEX MICROSCOPIC - Abnormal; Notable for the following:    Color, Urine STRAW (*)    APPearance HAZY (*)    Leukocytes, UA SMALL (*)    Bacteria, UA RARE (*)    All other components within normal limits  LIPASE, BLOOD  PREGNANCY, URINE    EKG  EKG Interpretation None       Radiology No results found.  Procedures Procedures (including critical care time)  Medications Ordered in ED Medications  oxyCODONE-acetaminophen (PERCOCET/ROXICET) 5-325 MG per tablet 1 tablet (1 tablet Oral Given 08/20/16 2119)     Initial Impression / Assessment and Plan / ED Course  I have reviewed the triage vital signs and the nursing notes.  Pertinent labs & imaging results that were available during my care of the patient were reviewed by me and considered in my medical decision making (see chart for details).  Clinical Course    Pt with complaint of RUQ pain.  Pain resolved after pain medication.  Labs reassuring.  No CP, dyspnea, vitals stable.  PERC neg.  Prior cholecystectomy.  Pain possibly musculoskeletal.  Pt states she's ready for d/c, Discussed close f/u and strict ER return precautions.    Final Clinical Impressions(s) / ED Diagnoses   Final diagnoses:  Abdominal pain, unspecified abdominal location    New Prescriptions Discharge Medication List as of 08/20/2016 11:21 PM    START taking these medications   Details  traMADol (ULTRAM) 50 MG tablet Take 1 tablet (50 mg total) by mouth every 6 (six) hours as needed., Starting Mon 08/20/2016, Print         Loye Vento Epps, PA-C 08/24/16 [place], MD 08/25/16 1045

## 2016-11-07 ENCOUNTER — Emergency Department (HOSPITAL_COMMUNITY)
Admission: EM | Admit: 2016-11-07 | Discharge: 2016-11-07 | Disposition: A | Discharge status: Home / Self Care | Payer: BLUE CROSS/BLUE SHIELD | Attending: Emergency Medicine | Admitting: Emergency Medicine

## 2016-11-07 ENCOUNTER — Emergency Department (HOSPITAL_COMMUNITY): Payer: BLUE CROSS/BLUE SHIELD

## 2016-11-07 ENCOUNTER — Encounter (HOSPITAL_COMMUNITY): Payer: Self-pay | Admitting: Emergency Medicine

## 2016-11-07 DIAGNOSIS — R1032 Left lower quadrant pain: Secondary | ICD-10-CM | POA: Diagnosis present

## 2016-11-07 DIAGNOSIS — D259 Leiomyoma of uterus, unspecified: Secondary | ICD-10-CM | POA: Insufficient documentation

## 2016-11-07 DIAGNOSIS — R102 Pelvic and perineal pain: Secondary | ICD-10-CM

## 2016-11-07 DIAGNOSIS — R52 Pain, unspecified: Secondary | ICD-10-CM

## 2016-11-07 LAB — CBC WITH DIFFERENTIAL/PLATELET
BASOS PCT: 1 %
Basophils Absolute: 0 10*3/uL (ref 0.0–0.1)
EOS ABS: 0.3 10*3/uL (ref 0.0–0.7)
EOS PCT: 4 %
HCT: 30.7 % — ABNORMAL LOW (ref 36.0–46.0)
HEMOGLOBIN: 9.6 g/dL — AB (ref 12.0–15.0)
Lymphocytes Relative: 29 %
Lymphs Abs: 1.9 10*3/uL (ref 0.7–4.0)
MCH: 25.7 pg — AB (ref 26.0–34.0)
MCHC: 31.3 g/dL (ref 30.0–36.0)
MCV: 82.3 fL (ref 78.0–100.0)
MONO ABS: 0.4 10*3/uL (ref 0.1–1.0)
MONOS PCT: 7 %
Neutro Abs: 3.8 10*3/uL (ref 1.7–7.7)
Neutrophils Relative %: 59 %
Platelets: 349 10*3/uL (ref 150–400)
RBC: 3.73 MIL/uL — AB (ref 3.87–5.11)
RDW: 18.5 % — ABNORMAL HIGH (ref 11.5–15.5)
WBC: 6.5 10*3/uL (ref 4.0–10.5)

## 2016-11-07 LAB — BASIC METABOLIC PANEL
Anion gap: 10 (ref 5–15)
BUN: 6 mg/dL (ref 6–20)
CHLORIDE: 101 mmol/L (ref 101–111)
CO2: 25 mmol/L (ref 22–32)
Calcium: 9 mg/dL (ref 8.9–10.3)
Creatinine, Ser: 0.61 mg/dL (ref 0.44–1.00)
GFR calc Af Amer: >60 mL/min (ref >60)
GFR calc non Af Amer: >60 mL/min (ref >60)
GLUCOSE: 118 mg/dL — AB (ref 65–99)
POTASSIUM: 3.6 mmol/L (ref 3.5–5.1)
Sodium: 136 mmol/L (ref 135–145)

## 2016-11-07 LAB — URINALYSIS, ROUTINE W REFLEX MICROSCOPIC
Bilirubin Urine: NEGATIVE
Glucose, UA: NEGATIVE mg/dL
HGB URINE DIPSTICK: NEGATIVE
Ketones, ur: NEGATIVE mg/dL
Leukocytes, UA: NEGATIVE
Nitrite: NEGATIVE
Protein, ur: NEGATIVE mg/dL
pH: 5.5 (ref 5.0–8.0)

## 2016-11-07 LAB — POC URINE PREG, ED: PREG TEST UR: NEGATIVE

## 2016-11-07 MED ORDER — KETOROLAC TROMETHAMINE 30 MG/ML IJ SOLN
30.0000 mg | Freq: Once | INTRAMUSCULAR | Status: AC
  Administered 2016-11-07: 30 mg via INTRAVENOUS
  Filled 2016-11-07: qty 1

## 2016-11-07 MED ORDER — SODIUM CHLORIDE 0.9 % IV SOLN
Freq: Once | INTRAVENOUS | Status: AC
  Administered 2016-11-07: 03:00:00 via INTRAVENOUS

## 2016-11-07 MED ORDER — IBUPROFEN 800 MG PO TABS: 800.0000 mg | ORAL_TABLET | Freq: Three times a day (TID) | ORAL | 0 refills | Status: DC

## 2016-11-07 MED ORDER — ONDANSETRON HCL 4 MG/2ML IJ SOLN
4.0000 mg | Freq: Once | INTRAMUSCULAR | Status: AC
  Administered 2016-11-07: 4 mg via INTRAVENOUS
  Filled 2016-11-07: qty 2

## 2016-11-07 MED ORDER — OXYCODONE-ACETAMINOPHEN 5-325 MG PO TABS: 1.0000 | ORAL_TABLET | ORAL | 0 refills | Status: DC | PRN

## 2016-11-07 MED ORDER — HYDROMORPHONE HCL 1 MG/ML IJ SOLN
1.0000 mg | Freq: Once | INTRAMUSCULAR | Status: AC
  Administered 2016-11-07: 1 mg via INTRAVENOUS
  Filled 2016-11-07: qty 1

## 2016-11-07 MED ORDER — PROMETHAZINE HCL 25 MG PO TABS: 25.0000 mg | ORAL_TABLET | Freq: Four times a day (QID) | ORAL | 0 refills | Status: DC | PRN

## 2016-11-07 MED ORDER — MORPHINE SULFATE (PF) 4 MG/ML IV SOLN
4.0000 mg | Freq: Once | INTRAVENOUS | Status: AC
  Administered 2016-11-07: 4 mg via INTRAVENOUS
  Filled 2016-11-07: qty 1

## 2016-11-07 NOTE — ED Notes (Signed)
Dr. Laverta Baltimore at bedside assessing pt due to decreasing 02 sats while sleeping.  Pt denies cough or sob at this time.  When awake pt 02 sats come up to 92-95%.  Pt alert and oriented. Lungs clear.

## 2016-11-07 NOTE — ED Notes (Addendum)
Upon discharge, pt 02 sat was 86% on RA, pt was sleeping at the time.  When asking pt to take deep breaths through nose, 02 sat increased to 97%.  Dr. Laverta Baltimore informed of findings and explained to hold pt for monitoring for an hour and will assess pt if she continues to drop on RA.  PT placed on 2L 02 [place] at this time and is okay with plan to stay for another hour for monitoring.

## 2016-11-07 NOTE — ED Notes (Signed)
Walked pt around nurses station, pt did not have any difficulty ambulating and did not get sob.  Pt 02 sats ranged from 86-98% on RA while ambulating. Dr. Laverta Baltimore informed.

## 2016-11-07 NOTE — ED Triage Notes (Signed)
Pt c/o left lower abd pain with nausea and denies any vomiting since yesterday.

## 2016-11-07 NOTE — ED Notes (Addendum)
Pt made aware to return if symptoms worsen or if any life threatening symptoms occur.  Pt told to come back for sob.  Pt told not to take percocet or phenergen in next few hours due to drowsiness and low 02 sats.

## 2016-11-07 NOTE — ED Notes (Signed)
Dr. Laverta Baltimore at bedside speaking to pt prior to discharge. Dr. Laverta Baltimore is okay with letting pt be discharged at this time with 02 sats 92%.

## 2016-11-07 NOTE — Discharge Instructions (Addendum)
You were seen in the ED today with pelvic pain. Schedule follow up as needed. You were also found to have low oxygen levels which may be due to some of the pain medications given today but may also be a warning for underlying lung disease. You will need to discuss this with your PCP as soon as possible.   Return to the ED immediately with any worsening abdominal pain, difficulty breathing, or chest pain.

## 2016-11-07 NOTE — ED Provider Notes (Signed)
[facility] DEPT Provider Note   CSN: [id number] Arrival date & time: 11/07/16  0154     History   Chief Complaint Chief Complaint  Patient presents with  . Abdominal Pain    HPI Mallory Neal is a 46 y.o. female.  Presents to the emergency department with complaints of left lower abdominal pain. Patient reports that the pain started yesterday. She reports sharp and severe pain that has been constant. It feels like pain that she has had with her ovary in the past. Tonight, however, she has had nausea and vomiting. This is unusual for her ovarian pain. No diarrhea. She denies urinary symptoms.      Past Medical History:  Diagnosis Date  . Chest pain   . Heart murmur     Patient Active Problem List   Diagnosis Date Noted  . Abdominal pain, LLQ 11/07/2016  . Chest pain   . Heart murmur     Past Surgical History:  Procedure Laterality Date  . CHOLECYSTECTOMY      OB History    No data available       Home Medications    Prior to Admission medications   Medication Sig Start Date End Date Taking? Authorizing Provider  diclofenac (VOLTAREN) 75 MG EC tablet Take 1 tablet (75 mg total) by mouth 2 (two) times daily. 05/09/15  Yes Lily Kocher, PA-C  naproxen (NAPROSYN) 500 MG tablet Take 1 po BID with food prn pain 08/17/15  Yes Rolland Porter, MD  ondansetron (ZOFRAN) 4 MG tablet Take 1 tablet (4 mg total) by mouth every 8 (eight) hours as needed for nausea or vomiting. 08/17/15  Yes Rolland Porter, MD  traMADol (ULTRAM) 50 MG tablet Take 1 tablet (50 mg total) by mouth every 6 (six) hours as needed. 08/20/16  Yes Tammy Triplett, PA-C  aspirin EC 81 MG tablet Take 81 mg by mouth once.    Historical Provider, MD    Family History History reviewed. No pertinent family history.  Social History Social History  Substance Use Topics  . Smoking status: Never Smoker  . Smokeless tobacco: Never Used  . Alcohol use Yes     Comment: occ     Allergies   Codeine   Review of  Systems Review of Systems  Gastrointestinal: Positive for abdominal pain, nausea and vomiting.  Genitourinary: Positive for pelvic pain.  All other systems reviewed and are negative.    Physical Exam Updated Vital Signs BP 126/73   Pulse 83   Temp 97.7 F (36.5 C)   Resp 18   Ht 5\' 7"  (1.702 m)   Wt 225 lb (102.1 kg)   SpO2 92%   BMI 35.24 kg/m   Physical Exam  Constitutional: She is oriented to person, place, and time. She appears well-developed and well-nourished. No distress.  HENT:  Head: Normocephalic and atraumatic.  Right Ear: Hearing normal.  Left Ear: Hearing normal.  Nose: Nose normal.  Mouth/Throat: Oropharynx is clear and moist and mucous membranes are normal.  Eyes: Conjunctivae and EOM are normal. Pupils are equal, round, and reactive to light.  Neck: Normal range of motion. Neck supple.  Cardiovascular: Regular rhythm, S1 normal and S2 normal.  Exam reveals no gallop and no friction rub.   No murmur heard. Pulmonary/Chest: Effort normal and breath sounds normal. No respiratory distress. She exhibits no tenderness.  Abdominal: Soft. Normal appearance and bowel sounds are normal. There is no hepatosplenomegaly. There is no rebound, no guarding, no tenderness at McBurney's  point and negative Murphy's sign. No hernia.  Genitourinary: Vagina normal. There is no rash on the right labia. There is no rash on the left labia. Uterus is not tender. Cervix exhibits no motion tenderness, no discharge and no friability. Right adnexum displays no mass. Left adnexum displays tenderness. Left adnexum displays no mass.  Musculoskeletal: Normal range of motion.  Neurological: She is alert and oriented to person, place, and time. She has normal strength. No cranial nerve deficit or sensory deficit. Coordination normal. GCS eye subscore is 4. GCS verbal subscore is 5. GCS motor subscore is 6.  Skin: Skin is warm, dry and intact. No rash noted. No cyanosis.  Psychiatric: She has a  normal mood and affect. Her speech is normal and behavior is normal. Thought content normal.  Nursing note and vitals reviewed.    ED Treatments / Results  Labs (all labs ordered are listed, but only abnormal results are displayed) Labs Reviewed  CBC WITH DIFFERENTIAL/PLATELET - Abnormal; Notable for the following:       Result Value   RBC 3.73 (*)    Hemoglobin 9.6 (*)    HCT 30.7 (*)    MCH 25.7 (*)    RDW 18.5 (*)    All other components within normal limits  BASIC METABOLIC PANEL - Abnormal; Notable for the following:    Glucose, Bld 118 (*)    All other components within normal limits  URINALYSIS, ROUTINE W REFLEX MICROSCOPIC - Abnormal; Notable for the following:    Specific Gravity, Urine >1.030 (*)    All other components within normal limits  POC URINE PREG, ED  GC/CHLAMYDIA PROBE AMP ([place]) NOT AT [facility]    EKG  EKG Interpretation None       Radiology US Transvaginal Non-ob  Result Date: 11/07/2016 CLINICAL DATA:  46 y/o F; two days of left lower quadrant abdominal pain. EXAM: TRANSABDOMINAL AND TRANSVAGINAL ULTRASOUND OF PELVIS DOPPLER ULTRASOUND OF OVARIES TECHNIQUE: Both transabdominal and transvaginal ultrasound examinations of the pelvis were performed. Transabdominal technique was performed for global imaging of the pelvis including uterus, ovaries, adnexal regions, and pelvic cul-de-sac. It was necessary to proceed with endovaginal exam following the transabdominal exam to visualize the uterus and adnexa. Color and duplex Doppler ultrasound was utilized to evaluate blood flow to the ovaries. COMPARISON:  11/07/2016 CT of the abdomen and pelvis. FINDINGS: Uterus Measurements: 9.3 x 5.3 x 6.3 cm. Right anterior fundal uterine fibroid measuring up to 1.4 cm. Pelvic mass with mildly heterogeneous intermediate echogenicity measuring 11.2 x 8.9 x 8.0 cm superior to the uterine fundus. There appears to be a thin stalk with the uterine fundus (series 1, image 2 of  95 and cine series image 34 of 74). Endometrium Thickness: 10 mm.  No focal abnormality visualized. Right ovary Discrete right ovary not visualized. Left ovary Discrete left ovary not visualized. Doppler evaluation of the pelvic mass demonstrates no appreciable vascularity. Other findings No abnormal free fluid. 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 Discrete ovaries are not identified. Electronically Signed   By: Kristine Garbe M.D.   On: 11/07/2016 06:37   US Pelvis Complete  Result Date: 11/07/2016 CLINICAL DATA:  46 y/o F; two days of left lower quadrant abdominal pain. EXAM: TRANSABDOMINAL AND TRANSVAGINAL ULTRASOUND OF PELVIS DOPPLER ULTRASOUND OF OVARIES TECHNIQUE: Both transabdominal and transvaginal ultrasound examinations of the pelvis were performed. Transabdominal technique was performed for global imaging  of the pelvis including uterus, ovaries, adnexal regions, and pelvic cul-de-sac. It was necessary to proceed with endovaginal exam following the transabdominal exam to visualize the uterus and adnexa. Color and duplex Doppler ultrasound was utilized to evaluate blood flow to the ovaries. COMPARISON:  11/07/2016 CT of the abdomen and pelvis. FINDINGS: Uterus Measurements: 9.3 x 5.3 x 6.3 cm. Right anterior fundal uterine fibroid measuring up to 1.4 cm. Pelvic mass with mildly heterogeneous intermediate echogenicity measuring 11.2 x 8.9 x 8.0 cm superior to the uterine fundus. There appears to be a thin stalk with the uterine fundus (series 1, image 2 of 95 and cine series image 34 of 74). Endometrium Thickness: 10 mm.  No focal abnormality visualized. Right ovary Discrete right ovary not visualized. Left ovary Discrete left ovary not visualized. Doppler evaluation of the pelvic mass demonstrates no appreciable vascularity. Other findings No abnormal free fluid. 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 Discrete ovaries are not identified. Electronically Signed   By: Kristine Garbe M.D.   On: 11/07/2016 06:37   [place] Art/ven Flow Abd Pelv Doppler  Result Date: 11/07/2016 CLINICAL DATA:  46 y/o F; two days of left lower quadrant abdominal pain. EXAM: TRANSABDOMINAL AND TRANSVAGINAL ULTRASOUND OF PELVIS DOPPLER ULTRASOUND OF OVARIES TECHNIQUE: Both transabdominal and transvaginal ultrasound examinations of the pelvis were performed. Transabdominal technique was performed for global imaging of the pelvis including uterus, ovaries, adnexal regions, and pelvic cul-de-sac. It was necessary to proceed with endovaginal exam following the transabdominal exam to visualize the uterus and adnexa. Color and duplex Doppler ultrasound was utilized to evaluate blood flow to the ovaries. COMPARISON:  11/07/2016 CT of the abdomen and pelvis. FINDINGS: Uterus Measurements: 9.3 x 5.3 x 6.3 cm. Right anterior fundal uterine fibroid measuring up to 1.4 cm. Pelvic mass with mildly heterogeneous intermediate echogenicity measuring 11.2 x 8.9 x 8.0 cm superior to the uterine fundus. There appears to be a thin stalk with the uterine fundus (series 1, image 2 of 95 and cine series image 34 of 74). Endometrium Thickness: 10 mm.  No focal abnormality visualized. Right ovary Discrete right ovary not visualized. Left ovary Discrete left ovary not visualized. Doppler evaluation of the pelvic mass demonstrates no appreciable vascularity. Other findings No abnormal free fluid. 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 Discrete ovaries are not identified. Electronically Signed   By: Kristine Garbe M.D.   On: 11/07/2016 06:37   Ct Renal Stone Study  Result Date: 11/07/2016 CLINICAL DATA:  46 y/o F; left lower abdominal pain with nausea and vomiting. EXAM: CT  ABDOMEN AND PELVIS WITHOUT CONTRAST TECHNIQUE: Multidetector CT imaging of the abdomen and pelvis was performed following the standard protocol without IV contrast. COMPARISON:  None. FINDINGS: Lower chest: No acute abnormality. Hepatobiliary: No focal liver abnormality is seen. Status post cholecystectomy. No biliary dilatation. Pancreas: Unremarkable. No pancreatic ductal dilatation or surrounding inflammatory changes. Spleen: Normal in size without focal abnormality. Adrenals/Urinary Tract: Adrenal glands are unremarkable. Kidneys are normal, without renal calculi, focal lesion, or hydronephrosis. Bladder is unremarkable. Stomach/Bowel: Stomach is within normal limits. Appendix appears normal. No evidence of bowel wall thickening, distention, or inflammatory changes. Vascular/Lymphatic: No significant vascular findings are present. No enlarged abdominal or pelvic lymph nodes. Reproductive: Pelvic mass measuring 9.1 x 10.0 x 11.1 cm (AP x ML x CC series 2, image  72 and series 6, image 71). 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 Other: No abdominal wall hernia or abnormality. No abdominopelvic ascites. 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 Minimal degenerative grade 1 L3-4 anterolisthesis. Multilevel lumbar canal stenosis likely severe at the L3-4 level. IMPRESSION: 1. 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 Pelvic ultrasound recommended for further characterization. 2. No urinary stone disease or hydronephrosis. 3. Status post cholecystectomy. 4. 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 Electronically Signed   By: Kristine Garbe M.D.   On: 11/07/2016 03:53    Procedures Procedures (including critical care  time)  Medications Ordered in ED Medications  0.9 %  sodium chloride infusion ( Intravenous Stopped 11/07/16 0616)  ondansetron (ZOFRAN) injection 4 mg (4 mg Intravenous Given 11/07/16 0236)  morphine 4 MG/ML injection 4 mg (4 mg Intravenous Given 11/07/16 0237)  HYDROmorphone (DILAUDID) injection 1 mg (1 mg Intravenous Given 11/07/16 0432)  ketorolac (TORADOL) 30 MG/ML injection 30 mg (30 mg Intravenous Given 11/07/16 0710)  HYDROmorphone (DILAUDID) injection 1 mg (1 mg Intravenous Given 11/07/16 0710)     Initial Impression / Assessment and Plan / ED Course  I have reviewed the triage vital signs and the nursing notes.  Pertinent labs & imaging results that were available during my care of the patient were reviewed by me and considered in my medical decision making (see chart for details).     Patient presents to the emergency department for evaluation of severe left lower pelvic pain. She reports previous "ovarian pain" similar to this. She is not sure what the etiology was. Patient was in distress at arrival, actively vomiting. Abdominal exam, however, was not consistent with acute surgical process. She actually does not have abdominal tenderness. She points to the left inguinal area as the area where she is experiencing pain and does have some tenderness in this region, however, left lower quadrant of the abdomen is benign. There is no abdominal tenderness on exam. A CT Stone study did not show evidence of kidney stone. No evidence of bowel obstruction, inflammation, etc. There was evidence of exophytic myoma. An ultrasound including Doppler was performed. No evidence of ovarian torsion, but does confirm the myoma.  Findings discussed with Dr. Glo Herring, on call for OB/GYN. He confirms that this is not a surgical emergency. He did review the images and felt that the fibroid was too small to be the source of the patient's pain.  Patient's lab work is unremarkable. CAT scan did not show any  abnormal bowel or inflammatory changes. Source of the pain is unclear at this time, however, she clearly is indicated in the left pelvic region as where she is experiencing pain. Pain is improving with analgesia.   Final Clinical Impressions(s) / ED Diagnoses   Final diagnoses:  Pelvic pain in female  Uterine leiomyoma, unspecified location    New Prescriptions New Prescriptions   No medications on file     Orpah Greek, MD 11/07/16 [phone]

## 2016-11-07 NOTE — ED Provider Notes (Signed)
Blood pressure 127/80, pulse 90, temperature 98.2 F (36.8 C), temperature source Oral, resp. rate 18, height 5\' 7"  (1.702 m), weight 225 lb (102.1 kg), SpO2 97 %.  Assuming care from Dr. Betsey Holiday.  In short, Mallory Neal is a 46 y.o. female with a chief complaint of Abdominal Pain .  Refer to the original H&P for additional details.  08:00 AM Patient is resting on monitor and when asleep her oxygen saturation drops into the 80s. She wakes and returns normal oxygen saturation. She has received multiple doses of IV pain medication which may be contributing. Plan for continued monitoring and reassessment.  08:53 AM Plan for ambulation of the patient. She is not feeling dyspneic. She is not a smoker. She has no respiratory complaints at this time. Does seem slightly drowsy from pain medication and also reports that she didn't sleep well last night.  09:08 AM Patient briskly walking around the ED with no dyspnea. Mild desaturation noted. Without any respiratory symptoms I do not feel that this patient requires home O2 arrangement or admission to the hospital. Discussed he oxygen and abdominal pain in detail. She will call PCP today to schedule outpatient appointment to continue w/u.   Nanda Quinton, MD    Margette Fast, MD 11/07/16 [phone]

## 2016-11-08 LAB — GC/CHLAMYDIA PROBE AMP (~~LOC~~) NOT AT ARMC
Chlamydia: NEGATIVE
Neisseria Gonorrhea: NEGATIVE

## 2016-11-15 ENCOUNTER — Encounter: Payer: Self-pay | Admitting: Obstetrics & Gynecology

## 2016-11-15 ENCOUNTER — Ambulatory Visit (INDEPENDENT_AMBULATORY_CARE_PROVIDER_SITE_OTHER): Payer: BLUE CROSS/BLUE SHIELD | Admitting: Obstetrics & Gynecology

## 2016-11-15 ENCOUNTER — Other Ambulatory Visit (HOSPITAL_COMMUNITY)
Admission: RE | Admit: 2016-11-15 | Discharge: 2016-11-15 | Disposition: A | Discharge status: Home / Self Care | Payer: BLUE CROSS/BLUE SHIELD | Source: Ambulatory Visit | Attending: Obstetrics & Gynecology | Admitting: Obstetrics & Gynecology

## 2016-11-15 VITALS — BP 134/84 | HR 98 | Ht 66.0 in | Wt 214.0 lb

## 2016-11-15 DIAGNOSIS — Z01419 Encounter for gynecological examination (general) (routine) without abnormal findings: Secondary | ICD-10-CM

## 2016-11-15 DIAGNOSIS — D259 Leiomyoma of uterus, unspecified: Secondary | ICD-10-CM | POA: Diagnosis not present

## 2016-11-15 MED ORDER — MEGESTROL ACETATE 40 MG PO TABS: 40.0000 mg | ORAL_TABLET | Freq: Every day | ORAL | 1 refills | Status: DC

## 2016-11-15 NOTE — Progress Notes (Signed)
Preoperative History and Physical  Mallory Neal is a 46 y.o. G2P2 with No LMP recorded (lmp unknown). admitted for a TAH BSO.  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 Chest pain   . Heart murmur     PSH:     Past Surgical History:  Procedure Laterality Date  . CHOLECYSTECTOMY      POb/GynH:      OB History    Gravida Para Term Preterm AB Living   2 2           SAB TAB Ectopic Multiple Live Births                  SH:   Social History  Substance Use Topics  . Smoking status: Never Smoker  . Smokeless tobacco: Never Used  . Alcohol use Yes     Comment: occ    FH:    Family History  Problem Relation Age of Onset  . Diabetes Paternal Grandfather   . Heart disease Paternal Grandmother   . Heart disease Maternal Grandmother   . Cancer Father     lung     Allergies:  Allergies  Allergen Reactions  . Codeine Hives, Shortness Of Breath and Rash    Medications:       Current Outpatient Prescriptions:  .  aspirin EC 81 MG tablet, Take 81 mg by mouth once., Disp: , Rfl:  .  ibuprofen (ADVIL,MOTRIN) 800 MG tablet, Take 1 tablet (800 mg total) by mouth 3 (three) times daily., Disp: 21 tablet, Rfl: 0 .  oxyCODONE-acetaminophen (PERCOCET) 5-325 MG tablet, Take 1-2 tablets by mouth every 4 (four) hours as needed., Disp: 15 tablet, Rfl: 0 .  promethazine (PHENERGAN) 25 MG tablet, Take 1 tablet (25 mg total) by mouth every 6 (six) hours as needed for nausea or vomiting., Disp: 30 tablet, Rfl: 0 .  traMADol (ULTRAM) 50 MG tablet, Take 1 tablet (50 mg total) by mouth every 6 (six) hours as needed., Disp: 8 tablet, Rfl: 0 .  naproxen  (NAPROSYN) 500 MG tablet, Take 1 po BID with food prn pain (Patient not taking: Reported on 11/15/2016), Disp: 30 tablet, Rfl: 0  Review of Systems:   Review of Systems  Constitutional: Negative for fever, chills, weight loss, malaise/fatigue and diaphoresis.  HENT: Negative for hearing loss, ear pain, nosebleeds, congestion, sore throat, neck pain, tinnitus and ear discharge.   Eyes: Negative for blurred vision, double vision, photophobia, pain, discharge and redness.  Respiratory: Negative for cough, hemoptysis, sputum production, shortness of breath, wheezing and stridor.   Cardiovascular: Negative for chest pain, palpitations, orthopnea, claudication, leg swelling and PND.  Gastrointestinal: Positive for abdominal pain. Negative for heartburn, nausea, vomiting, diarrhea, constipation, blood in stool and melena.  Genitourinary: Negative for dysuria, urgency, frequency, hematuria and flank pain.  Musculoskeletal: Negative for myalgias, back pain, joint pain and falls.  Skin: Negative for itching and rash.  Neurological: Negative for dizziness, tingling, tremors, sensory change, speech change, focal weakness, seizures, loss of consciousness, weakness and headaches.  Endo/Heme/Allergies: Negative for environmental allergies and polydipsia. Does not bruise/bleed easily.  Psychiatric/Behavioral: Negative for depression,  suicidal ideas, hallucinations, memory loss and substance abuse. The patient is not nervous/anxious and does not have insomnia.      PHYSICAL EXAM:  Blood pressure 134/84, pulse 98, height 5\' 6"  (1.676 m), weight 214 lb (97.1 kg).    Vitals reviewed. Constitutional: She is oriented to person, place, and time. She appears well-developed and well-nourished.  HENT:  Head: Normocephalic and atraumatic.  Right Ear: External ear normal.  Left Ear: External ear normal.  Nose: Nose normal.  Mouth/Throat: Oropharynx is clear and moist.  Eyes: Conjunctivae and EOM are normal.  Pupils are equal, round, and reactive to light. Right eye exhibits no discharge. Left eye exhibits no discharge. No scleral icterus.  Neck: Normal range of motion. Neck supple. No tracheal deviation present. No thyromegaly present.  Cardiovascular: Normal rate, regular rhythm, normal heart sounds and intact distal pulses.  Exam reveals no gallop and no friction rub.   No murmur heard. Respiratory: Effort normal and breath sounds normal. No respiratory distress. She has no wheezes. She has no rales. She exhibits no tenderness.  GI: Soft. Bowel sounds are normal. She exhibits no distension and no mass. There is tenderness. There is no rebound and no guarding.  Genitourinary:       Vulva is normal without lesions Vagina is pink moist without discharge Cervix normal in appearance and pap is normal Uterus is enlarged overall size with pedunculated myoma is 20 weeks size(to the umbilicus) Adnexa is negative with normal sized ovaries by sonogram  Musculoskeletal: Normal range of motion. She exhibits no edema and no tenderness.  Neurological: She is alert and oriented to person, place, and time. She has normal reflexes. She displays normal reflexes. No cranial nerve deficit. She exhibits normal muscle tone. Coordination normal.  Skin: Skin is warm and dry. No rash noted. No erythema. No pallor.  Psychiatric: She has a normal mood and affect. Her behavior is normal. Judgment and thought content normal.    Labs: Results for orders placed or performed during the hospital encounter of 11/07/16 (from the past 336 hour(s))  GC/Chlamydia probe amp ([place])not at [facility]   Collection Time: 11/07/16 12:00 AM  Result Value Ref Range   Chlamydia Negative    Neisseria gonorrhea Negative   Urinalysis, Routine w reflex microscopic   Collection Time: 11/07/16  2:13 AM  Result Value Ref Range   Color, Urine YELLOW YELLOW   APPearance CLEAR CLEAR   Specific Gravity, Urine >1.030 (H) 1.005 - 1.030   pH 5.5  5.0 - 8.0   Glucose, UA NEGATIVE NEGATIVE mg/dL   Hgb urine dipstick NEGATIVE NEGATIVE   Bilirubin Urine NEGATIVE NEGATIVE   Ketones, ur NEGATIVE NEGATIVE mg/dL   Protein, ur NEGATIVE NEGATIVE mg/dL   Nitrite NEGATIVE NEGATIVE   Leukocytes, UA NEGATIVE NEGATIVE  CBC with Differential/Platelet   Collection Time: 11/07/16  2:15 AM  Result Value Ref Range   WBC 6.5 4.0 - 10.5 K/uL   RBC 3.73 (L) 3.87 - 5.11 MIL/uL   Hemoglobin 9.6 (L) 12.0 - 15.0 g/dL   HCT 30.7 (L) 36.0 - 46.0 %   MCV 82.3 78.0 - 100.0 fL   MCH 25.7 (L) 26.0 - 34.0 pg   MCHC 31.3 30.0 - 36.0 g/dL   RDW 18.5 (H) 11.5 - 15.5 %   Platelets 349 150 - 400 K/uL   Neutrophils Relative % 59 %   Neutro Abs 3.8 1.7 - 7.7 K/uL   Lymphocytes Relative 29 %   Lymphs Abs 1.9 0.7 -  4.0 K/uL   Monocytes Relative 7 %   Monocytes Absolute 0.4 0.1 - 1.0 K/uL   Eosinophils Relative 4 %   Eosinophils Absolute 0.3 0.0 - 0.7 K/uL   Basophils Relative 1 %   Basophils Absolute 0.0 0.0 - 0.1 K/uL  Basic metabolic panel   Collection Time: 11/07/16  2:15 AM  Result Value Ref Range   Sodium 136 135 - 145 mmol/L   Potassium 3.6 3.5 - 5.1 mmol/L   Chloride 101 101 - 111 mmol/L   CO2 25 22 - 32 mmol/L   Glucose, Bld 118 (H) 65 - 99 mg/dL   BUN 6 6 - 20 mg/dL   Creatinine, Ser 0.61 0.44 - 1.00 mg/dL   Calcium 9.0 8.9 - 10.3 mg/dL   GFR calc non Af Amer >60 >60 mL/min   GFR calc Af Amer >60 >60 mL/min   Anion gap 10 5 - 15  POC urine preg, ED (not at [facility])   Collection Time: 11/07/16  4:31 AM  Result Value Ref Range   Preg Test, Ur NEGATIVE NEGATIVE    EKG: Orders placed or performed during the hospital encounter of 08/16/15  . ED EKG within 10 minutes  . ED EKG within 10 minutes  . EKG 12-Lead  . EKG 12-Lead  . EKG    Imaging Studies: US Transvaginal Non-ob  Result Date: 11/07/2016 CLINICAL DATA:  46 y/o F; two days of left lower quadrant abdominal pain. EXAM: TRANSABDOMINAL AND TRANSVAGINAL ULTRASOUND OF PELVIS DOPPLER  ULTRASOUND OF OVARIES TECHNIQUE: Both transabdominal and transvaginal ultrasound examinations of the pelvis were performed. Transabdominal technique was performed for global imaging of the pelvis including uterus, ovaries, adnexal regions, and pelvic cul-de-sac. It was necessary to proceed with endovaginal exam following the transabdominal exam to visualize the uterus and adnexa. Color and duplex Doppler ultrasound was utilized to evaluate blood flow to the ovaries. COMPARISON:  11/07/2016 CT of the abdomen and pelvis. FINDINGS: Uterus Measurements: 9.3 x 5.3 x 6.3 cm. Right anterior fundal uterine fibroid measuring up to 1.4 cm. Pelvic mass with mildly heterogeneous intermediate echogenicity measuring 11.2 x 8.9 x 8.0 cm superior to the uterine fundus. There appears to be a thin stalk with the uterine fundus (series 1, image 2 of 95 and cine series image 34 of 74). Endometrium Thickness: 10 mm.  No focal abnormality visualized. Right ovary Discrete right ovary not visualized. Left ovary Discrete left ovary not visualized. Doppler evaluation of the pelvic mass demonstrates no appreciable vascularity. Other findings No abnormal free fluid. 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 Discrete ovaries are not identified. Electronically Signed   By: Kristine Garbe M.D.   On: 11/07/2016 06:37   US Pelvis Complete  Result Date: 11/07/2016 CLINICAL DATA:  46 y/o F; two days of left lower quadrant abdominal pain. EXAM: TRANSABDOMINAL AND TRANSVAGINAL ULTRASOUND OF PELVIS DOPPLER ULTRASOUND OF OVARIES TECHNIQUE: Both transabdominal and transvaginal ultrasound examinations of the pelvis were performed. Transabdominal technique was performed for global imaging of the pelvis including uterus, ovaries, adnexal regions, and pelvic cul-de-sac. It was necessary to proceed with endovaginal exam following the transabdominal exam to  visualize the uterus and adnexa. Color and duplex Doppler ultrasound was utilized to evaluate blood flow to the ovaries. COMPARISON:  11/07/2016 CT of the abdomen and pelvis. FINDINGS: Uterus Measurements: 9.3 x 5.3 x 6.3 cm. Right anterior fundal uterine fibroid measuring up to 1.4 cm. Pelvic  mass with mildly heterogeneous intermediate echogenicity measuring 11.2 x 8.9 x 8.0 cm superior to the uterine fundus. There appears to be a thin stalk with the uterine fundus (series 1, image 2 of 95 and cine series image 34 of 74). Endometrium Thickness: 10 mm.  No focal abnormality visualized. Right ovary Discrete right ovary not visualized. Left ovary Discrete left ovary not visualized. Doppler evaluation of the pelvic mass demonstrates no appreciable vascularity. Other findings No abnormal free fluid. 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 Discrete ovaries are not identified. Electronically Signed   By: Kristine Garbe M.D.   On: 11/07/2016 06:37   [place] Art/ven Flow Abd Pelv Doppler  Result Date: 11/07/2016 CLINICAL DATA:  46 y/o F; two days of left lower quadrant abdominal pain. EXAM: TRANSABDOMINAL AND TRANSVAGINAL ULTRASOUND OF PELVIS DOPPLER ULTRASOUND OF OVARIES TECHNIQUE: Both transabdominal and transvaginal ultrasound examinations of the pelvis were performed. Transabdominal technique was performed for global imaging of the pelvis including uterus, ovaries, adnexal regions, and pelvic cul-de-sac. It was necessary to proceed with endovaginal exam following the transabdominal exam to visualize the uterus and adnexa. Color and duplex Doppler ultrasound was utilized to evaluate blood flow to the ovaries. COMPARISON:  11/07/2016 CT of the abdomen and pelvis. FINDINGS: Uterus Measurements: 9.3 x 5.3 x 6.3 cm. Right anterior fundal uterine fibroid measuring up to 1.4 cm. Pelvic mass with mildly heterogeneous intermediate  echogenicity measuring 11.2 x 8.9 x 8.0 cm superior to the uterine fundus. There appears to be a thin stalk with the uterine fundus (series 1, image 2 of 95 and cine series image 34 of 74). Endometrium Thickness: 10 mm.  No focal abnormality visualized. Right ovary Discrete right ovary not visualized. Left ovary Discrete left ovary not visualized. Doppler evaluation of the pelvic mass demonstrates no appreciable vascularity. Other findings No abnormal free fluid. 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 Discrete ovaries are not identified. Electronically Signed   By: Kristine Garbe M.D.   On: 11/07/2016 06:37   Ct Renal Stone Study  Result Date: 11/07/2016 CLINICAL DATA:  46 y/o F; left lower abdominal pain with nausea and vomiting. EXAM: CT ABDOMEN AND PELVIS WITHOUT CONTRAST TECHNIQUE: Multidetector CT imaging of the abdomen and pelvis was performed following the standard protocol without IV contrast. COMPARISON:  None. FINDINGS: Lower chest: No acute abnormality. Hepatobiliary: No focal liver abnormality is seen. Status post cholecystectomy. No biliary dilatation. Pancreas: Unremarkable. No pancreatic ductal dilatation or surrounding inflammatory changes. Spleen: Normal in size without focal abnormality. Adrenals/Urinary Tract: Adrenal glands are unremarkable. Kidneys are normal, without renal calculi, focal lesion, or hydronephrosis. Bladder is unremarkable. Stomach/Bowel: Stomach is within normal limits. Appendix appears normal. No evidence of bowel wall thickening, distention, or inflammatory changes. Vascular/Lymphatic: No significant vascular findings are present. No enlarged abdominal or pelvic lymph nodes. Reproductive: Pelvic mass measuring 9.1 x 10.0 x 11.1 cm (AP x ML x CC series 2, image 72 and series 6, image 71). The mass appears contiguous with the fundus of the uterus and likely represents an  exophytic subserosal myoma. Other: No abdominal wall hernia or abnormality. No abdominopelvic ascites. 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 Minimal degenerative grade 1 L3-4 anterolisthesis. Multilevel lumbar canal stenosis likely severe at the L3-4 level. IMPRESSION: 1. 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  Pelvic ultrasound recommended for further characterization. 2. No urinary stone disease or hydronephrosis. 3. Status post cholecystectomy. 4. 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 Electronically Signed   By: Kristine Garbe M.D.   On: 11/07/2016 03:53      Assessment: Large fibroid uterus undergoing degeneration Menometrorrhagia  Dysmenorrhea Anemia Pelvic pain Patient Active Problem List   Diagnosis Date Noted  . Abdominal pain, LLQ 11/07/2016  . Chest pain   . Heart murmur     Plan: TAH BSO 12/05/2016  Will have blood available if needed  Pt understands the risks of surgery including but not limited t  excessive bleeding requiring transfusion or reoperation, post-operative infection requiring prolonged hospitalization or re-hospitalization and antibiotic therapy, and damage to other organs including bladder, bowel, ureters and major vessels.  The patient also understands the alternative treatment options which were discussed in full.  All questions were answered.  EURE,LUTHER H 11/15/2016 11:28 AM   EURE,LUTHER H 11/15/2016 11:24 AM

## 2016-11-21 LAB — CYTOLOGY - PAP
CHLAMYDIA, DNA PROBE: NEGATIVE
DIAGNOSIS: NEGATIVE
HPV (WINDOPATH): NOT DETECTED
Neisseria Gonorrhea: NEGATIVE

## 2016-11-29 NOTE — Patient Instructions (Signed)
Mallory Neal  11/29/2016     @[email or web address] @   Your procedure is scheduled on   12/05/2016   Report to [facility] at  700  A.M.  Call this number if you have problems the morning of surgery:  [phone]   Remember:  Do not eat food or drink liquids after midnight.  Take these medicines the morning of surgery with A SIP OF WATER percocet or ultram, phenergan (if needed).   Do not wear jewelry, make-up or nail polish.  Do not wear lotions, powders, or perfumes, or deoderant.  Do not shave 48 hours prior to surgery.  Men may shave face and neck.  Do not bring valuables to the hospital.  [facility] is not responsible for any belongings or valuables.  Contacts, dentures or bridgework may not be worn into surgery.  Leave your suitcase in the car.  After surgery it may be brought to your room.  For patients admitted to the hospital, discharge time will be determined by your treatment team.  Patients discharged the day of surgery will not be allowed to drive home.   Name and phone number of your driver:   family Special instructions:  None  Please read over the following fact sheets that you were given. Anesthesia Post-op Instructions and Care and Recovery After Surgery       Abdominal Hysterectomy Abdominal hysterectomy is a surgery to remove your womb (uterus). The womb is the part of your body that holds a growing baby. You may need this procedure if:  You have cancer.  You have growths (tumors or fibroids) in your uterus.  You have long-term (chronic) pain.  You are bleeding.  Your womb has slipped down into your vagina.  You have a condition in which the tissue that lines the womb grows outside of its normal place.  You have an infection in your womb.  You have problems with your period. You may also need other reproductive parts removed. This will depend on why you need to have the surgery. What happens before the procedure? Staying  hydrated  Follow instructions from your doctor about hydration. This may include:  Up to 2 hours before the procedure - you may continue to drink clear liquids, such as:  Water.  Fruit juice.  Black coffee.  Plain tea. Eating and drinking restrictions  Follow instructions from your doctor about eating and drinking. These may include:  8 hours before the procedure - stop eating heavy meals or foods, such as:  Meat.  Fried foods.  Fatty foods.  6 hours before the procedure - stop eating light meals or foods, such as:  Toast.  Cereal.  6 hours before the procedure - stop drinking:  Milk  Drinks that have milk in them.  2 hours before the procedure - stop drinking clear liquids. Medicines   Ask your doctor about:  Changing or stopping your normal medicines. This is important if you take diabetes medicines or blood thinners.  Taking medicines such as aspirin and ibuprofen. These medicines can thin your blood. Do not take these medicines before your procedure if your doctor tells you not to.  You may be given antibiotic medicine. This can help prevent infection.  You may be asked to take medicines that help you poop (laxatives). General instructions   Ask your doctor how your surgical site will be marked or identified.  You may be asked to shower with  a germ-killing soap.  Plan to have someone take you home from the hospital.  Do not use any products that contain nicotine or tobacco, such as cigarettes and e-cigarettes. If you need help quitting, ask your doctor.  You may have an exam or tests done.  You may have a blood or urine sample taken.  You may need to have an enema to clean out your rectum and lower colon.  Talk to your doctor about the changes this procedure may cause. These can be physical or emotional. What happens during the procedure?  To lower your risk of infection:  Your health care team will wash or clean their hands.  Your skin will  be washed with soap.  Hair may be removed from the surgical area.  An IV tube will be put into one of your veins.  You will be given one or more of the following:  A medicine to help you relax (sedative).  A medicine to make you fall asleep (general anesthetic).  Tight-fitting (compression) stockings will be placed on your legs to help with circulation.  A thin, flexible tube (catheter) will be inserted to help drain your urine.  The doctor will make a cut (incision) through the skin in your lower belly. It may go side-to-side or up-and-down.  The doctor will move the body tissues that cover your womb.  The doctor will remove your womb.  The doctor may take out any other parts that need to be removed.  The doctor will control the bleeding.  The doctor will close your cut with stitches (sutures), skin glue, or adhesive strips.  A bandage (dressing) will be placed over the cut. The procedure may vary among doctors and hospitals. What happens after the procedure?  You will be given pain medicine if you need it.  Your blood pressure, heart rate, breathing rate, and blood oxygen level will be watched until the medicines you were given have worn off.  You will need to stay in the hospital to recover. Ask your doctor how long you will need to stay in the hospital after your procedure.  You may have a liquid diet at first. You will most likely return to your usual diet the day after surgery.  You will still have the urinary catheter in place. It will likely be removed the day after surgery.  You may have to wear compression stockings. These stockings help to prevent blood clots and reduce swelling in your legs.  You will be encouraged to walk as soon as possible. You will also use a device or do breathing exercises to keep your lungs clear.  You may need to use a sanitary pad for vaginal discharge. Summary  Abdominal hysterectomy is a surgery to remove your womb (uterus). The  womb is the part of your body that holds a growing baby.  Talk to your doctor about the changes this procedure may cause. These can be physical or emotional.  You will be given pain medicine if you need it.  You will need to stay in the hospital to recover for one to two days. Ask your doctor how long you will need to stay in the hospital after your procedure. This information is not intended to replace advice given to you by your health care provider. Make sure you discuss any questions you have with your health care provider. Document Released: 08/04/2013 Document Revised: 07/18/2016 Document Reviewed: 07/18/2016 Elsevier Interactive Patient Education  2017 [place].  Abdominal Hysterectomy, Care After  This sheet gives you information about how to care for yourself after your procedure. Your doctor may also give you more specific instructions. If you have problems or questions, contact your doctor. Follow these instructions at home: Bathing   Do not take baths, swim, or use a hot tub until your doctor says it is okay. Ask your doctor if you can take showers. You may only be allowed to take sponge baths for bathing.  Keep the bandage (dressing) dry until your doctor says it can be taken off. Surgical cut (  incision) care  Follow instructions from your doctor about how to take care of your cut from surgery. Make sure you:  Wash your hands with soap and water before you change your bandage (dressing). If you cannot use soap and water, use hand sanitizer.  Change your bandage as told by your doctor.  Leave stitches (sutures), skin glue, or skin tape (adhesive) strips in place. They may need to stay in place for 2 weeks or longer. If tape strips get loose and curl up, you may trim the loose edges. Do not remove tape strips completely unless your doctor says it is okay.  Check your surgical cut area every day for signs of infection. Check for:  Redness, swelling, or pain.  Fluid  or blood.  Warmth.  Pus or a bad smell. Activity  Do gentle, daily exercise as told by your doctor. You may be told to take short walks every day and go farther each time.  Do not lift anything that is heavier than 10 lb (4.5 kg), or the limit that your doctor tells you, until he or she says that it is safe.  Do not drive or use heavy machinery while taking prescription pain medicine.  Do not drive for 24 hours if you were given a medicine to help you relax (sedative).  Follow your doctor's advice about exercise, driving, and general activities. Ask your doctor what activities are safe for you. Lifestyle  Do not douche, use tampons, or have sex for at least 6 weeks or as told by your doctor.  Do not drink alcohol until your doctor says it is okay.  Drink enough fluid to keep your pee (urine) clear or pale yellow.  Try to have someone at home with you for the first 1-2 weeks to help.  Do not use any products that contain nicotine or tobacco, such as cigarettes and e-cigarettes. These can slow down healing. If you need help quitting, ask your doctor. General instructions  Take over-the-counter and prescription medicines only as told by your doctor.  Do not take aspirin or ibuprofen. These medicines can cause bleeding.  To prevent or treat constipation while you are taking prescription pain medicine, your doctor may suggest that you:  Drink enough fluid to keep your urine clear or pale yellow.  Take over-the-counter or prescription medicines.  Eat foods that are high in fiber, such as:  Fresh fruits and vegetables.  Whole grains.  Beans.  Limit foods that are high in fat and processed sugars, such as fried and sweet foods.  Keep all follow-up visits as told by your doctor. This is important. Contact a doctor if:  You have chills or fever.  You have redness, swelling, or pain around your cut.  You have fluid or blood coming from your cut.  Your cut feels warm to  the touch.  You have pus or a bad smell coming from your cut.  Your cut breaks open.  You feel dizzy or light-headed.  You have pain or bleeding when you pee.  You keep having watery poop (diarrhea).  You keep feeling sick to your stomach (nauseous) or keep throwing up (vomiting).  You have unusual fluid (discharge) coming from your vagina.  You have a rash.  You have a reaction to your medicine.  Your pain medicine does not help. Get help right away if:  You have a fever and your symptoms get worse all of a sudden.  You have very bad belly (abdominal) pain.  You are short of breath.  You pass out (faint).  You have pain, swelling, or redness of your leg.  You bleed a lot from your vagina and notice clumps of blood (clots). Summary  Do not take baths, swim, or use a hot tub until your doctor says it is okay. Ask your doctor if you can take showers. You may only be allowed to take sponge baths for bathing.  Follow your doctor's advice about exercise, driving, and general activities. Ask your doctor what activities are safe for you.  Do not lift anything that is heavier than 10 lb (4.5 kg), or the limit that your doctor tells you, until he or she says that it is safe.  Try to have someone at home with you for the first 1-2 weeks to help. This information is not intended to replace advice given to you by your health care provider. Make sure you discuss any questions you have with your health care provider. Document Released: 05/08/2008 Document Revised: 07/18/2016 Document Reviewed: 07/18/2016 Elsevier Interactive Patient Education  2017 [employer]    Bilateral Salpingo-Oophorectomy Bilateral salpingo-oophorectomy is the surgical removal of both fallopian tubes and both ovaries. The ovaries are small organs that produce eggs in women. The fallopian tubes transport the egg from the ovary to the womb (uterus). Usually, when this surgery is done, the uterus was  previously removed. A bilateral salpingo-oophorectomy may be done to treat cancer or to reduce the risk of cancer in women who are at high risk. Removing both fallopian tubes and both ovaries will make you unable to become pregnant (sterile). It will also put you into menopause so that you will no longer have menstrual periods and may have menopausal symptoms such as hot flashes, night sweats, and mood changes. It will not affect your sex drive. Tell a health care provider about:  Any allergies you have.  All medicines you are taking, including vitamins, herbs, eye drops, creams, and over-the-counter medicines.  Previous problems you or family members have had with anesthetic medicines.  Any blood disorders you have.  Previous surgeries you have had.  Any medical conditions you have. What are the risks? Generally, this is a safe procedure. However, as with any procedure, complications can occur. Possible complications include:  Injury to surrounding organs.  Bleeding.  Infection.  Blood clots in the legs or lungs.  Problems related to anesthesia. What happens before the procedure?  Ask your health care provider about changing or stopping your regular medicines. You may need to stop taking certain medicines, such as aspirin or blood thinners, at least 1 week before the surgery.  Do not eat or drink anything for at least 8 hours before the surgery.  If you smoke, do not smoke for at least 2 weeks before the surgery.  Make plans to have someone drive you home after the procedure or after your hospital stay. Also arrange for someone to help you with activities during  recovery. What happens during the procedure?  You will be given medicine to help you relax before the procedure (sedative). You will then be given medicine to make you sleep through the procedure (general anesthetic). These medicines will be given through an IV access tube that is put into one of your veins.  Once you  are asleep, your lower abdomen will be shaved and cleaned. A thin, flexible tube (catheter) will be placed in your bladder.  The surgeon may use a laparoscopic, robotic, or open technique for this surgery:  In the laparoscopic technique, the surgery is done through two small cuts (incisions) in the abdomen. A thin, lighted tube with a tiny camera on the end (laparoscope) is inserted into one of the incisions. The tools needed for the procedure are put through the other incision.  A robotic technique may be chosen to perform complex surgery in a small space. In the robotic technique, small incisions will be made. A camera and surgical instruments are passed through the incisions. Surgical instruments will be controlled with the help of a robotic arm.  In the open technique, the surgery is done through one large incision in the abdomen.  Using any of these techniques, the surgeon removes the fallopian tubes and ovaries. The blood vessels will be clamped and tied.  The surgeon then uses staples or stitches to close the incision or incisions. What happens after the procedure?  You will be taken to a recovery area where you will be monitored for 1 to 3 hours. Your blood pressure, pulse, and temperature will be checked often. You will remain in the recovery area until you are stable and waking up.  If the laparoscopic technique was used, you may be allowed to go home after several hours. You may have some shoulder pain after the laparoscopic procedure. This is normal and usually goes away in a day or two.  If the open technique was used, you will be admitted to the hospital for a couple of days.  You will be given pain medicine as needed.  The IV access tube and catheter will be removed before you are discharged. This information is not intended to replace advice given to you by your health care provider. Make sure you discuss any questions you have with your health care provider. Document  Released: 07/30/2005 Document Revised: 06/29/2016 Document Reviewed: 01/21/2013 Elsevier Interactive Patient Education  2017 [employer] Bilateral Salpingo-Oophorectomy, Care After Refer to this sheet in the next few weeks. These instructions provide you with information on caring for yourself after your procedure. Your health care provider may also give you more specific instructions. Your treatment has been planned according to current medical practices, but problems sometimes occur. Call your health care provider if you have any problems or questions after your procedure. What can I expect after the procedure? After the procedure, it is typical to have the following:  Abdominal pain that can be controlled with medicine.  Vaginal spotting.  Constipation.  Menopausal symptoms such as hot flashes, vaginal dryness, and mood swings. Follow these instructions at home:  Get plenty of rest and sleep.  Only take over-the-counter or prescription medicines as directed by your health care provider. Do not take aspirin. It can cause bleeding.  Keep incision areas clean and dry. Remove or change bandages (dressings) only as directed by your health care provider.  Take showers instead of baths for a few weeks as directed by your health care provider.  Limit exercise and activities as  directed by your health care provider. Do not lift anything heavier than 5 pounds (2.3 kg) until your health care provider approves.  Do not drive until your health care provider approves.  Follow your health care provider's advice regarding diet. You may be able to resume your usual diet right away.  Drink enough fluids to keep your urine clear or pale yellow.  Do not douche, use tampons, or have sexual intercourse for 6 weeks after the procedure.  Do not drink alcohol until your health care provider says it is okay.  Take your temperature twice a day and write it down.  If you become constipated, you  may:  Ask your health care provider about taking a mild laxative.  Add more fruit and bran to your diet.  Drink more fluids.  Follow up with your health care provider as directed. Contact a health care provider if:  You have swelling, redness, or increasing pain in the incision area.  You see pus coming from the incision area.  You notice a bad smell coming from the wound or dressing.  You have pain, redness, or swelling where the IV access tube was placed.  Your incision is breaking open (the edges are not staying together).  You feel dizzy or feel like fainting.  You develop pain or bleeding when you urinate.  You develop diarrhea.  You develop nausea and vomiting.  You develop abnormal vaginal discharge.  You develop a rash.  You have pain that is not controlled with medicine. Get help right away if:  You develop a fever.  You develop abdominal pain.  You have chest pain.  You develop shortness of breath.  You pass out.  You develop pain, swelling, or redness in your leg.  You develop heavy vaginal bleeding with or without blood clots. This information is not intended to replace advice given to you by your health care provider. Make sure you discuss any questions you have with your health care provider. Document Released: 07/30/2005 Document Revised: 06/29/2016 Document Reviewed: 01/21/2013 Elsevier Interactive Patient Education  2017 [place] Anesthesia, Adult General anesthesia is the use of medicines to make a person "go to sleep" (be unconscious) for a medical procedure. General anesthesia is often recommended when a procedure:  Is long.  Requires you to be still or in an unusual position.  Is major and can cause you to lose blood.  Is impossible to do without general anesthesia. The medicines used for general anesthesia are called general anesthetics. In addition to making you sleep, the medicines:  Prevent pain.  Control your  blood pressure.  Relax your muscles. Tell a health care provider about:  Any allergies you have.  All medicines you are taking, including vitamins, herbs, eye drops, creams, and over-the-counter medicines.  Any problems you or family members have had with anesthetic medicines.  Types of anesthetics you have had in the past.  Any bleeding disorders you have.  Any surgeries you have had.  Any medical conditions you have.  Any history of heart or lung conditions, such as heart failure, sleep apnea, or chronic obstructive pulmonary disease (COPD).  Whether you are pregnant or may be pregnant.  Whether you use tobacco, alcohol, marijuana, or street drugs.  Any history of Armed forces logistics/support/administrative officer.  Any history of depression or anxiety. What are the risks? Generally, this is a safe procedure. However, problems may occur, including:  Allergic reaction to anesthetics.  Lung and heart problems.  Inhaling food or liquids  from your stomach into your lungs (aspiration).  Injury to nerves.  Waking up during your procedure and being unable to move (rare).  Extreme agitation or a state of mental confusion (delirium) when you wake up from the anesthetic.  Air in the bloodstream, which can lead to stroke. These problems are more likely to develop if you are having a major surgery or if you have an advanced medical condition. You can prevent some of these complications by answering all of your health care provider's questions thoroughly and by following all pre-procedure instructions. General anesthesia can cause side effects, including:  Nausea or vomiting  A sore throat from the breathing tube.  Feeling cold or shivery.  Feeling tired, washed out, or achy.  Sleepiness or drowsiness.  Confusion or agitation. What happens before the procedure? Staying hydrated  Follow instructions from your health care provider about hydration, which may include:  Up to 2 hours before the procedure  - you may continue to drink clear liquids, such as water, clear fruit juice, black coffee, and plain tea. Eating and drinking restrictions  Follow instructions from your health care provider about eating and drinking, which may include:  8 hours before the procedure - stop eating heavy meals or foods such as meat, fried foods, or fatty foods.  6 hours before the procedure - stop eating light meals or foods, such as toast or cereal.  6 hours before the procedure - stop drinking milk or drinks that contain milk.  2 hours before the procedure - stop drinking clear liquids. Medicines   Ask your health care provider about:  Changing or stopping your regular medicines. This is especially important if you are taking diabetes medicines or blood thinners.  Taking medicines such as aspirin and ibuprofen. These medicines can thin your blood. Do not take these medicines before your procedure if your health care provider instructs you not to.  Taking new dietary supplements or medicines. Do not take these during the week before your procedure unless your health care provider approves them.  If you are told to take a medicine or to continue taking a medicine on the day of the procedure, take the medicine with sips of water. General instructions    Ask if you will be going home the same day, the following day, or after a longer hospital stay.  Plan to have someone take you home.  Plan to have someone stay with you for the first 24 hours after you leave the hospital or clinic.  For 3-6 weeks before the procedure, try not to use any tobacco products, such as cigarettes, chewing tobacco, and e-cigarettes.  You may brush your teeth on the morning of the procedure, but make sure to spit out the toothpaste. What happens during the procedure?  You will be given anesthetics through a mask and through an IV tube in one of your veins.  You may receive medicine to help you relax (sedative).  As soon as  you are asleep, a breathing tube may be used to help you breathe.  An anesthesia specialist will stay with you throughout the procedure. He or she will help keep you comfortable and safe by continuing to give you medicines and adjusting the amount of medicine that you get. He or she will also watch your blood pressure, pulse, and oxygen levels to make sure that the anesthetics do not cause any problems.  If a breathing tube was used to help you breathe, it will be removed before you  wake up. The procedure may vary among health care providers and hospitals. What happens after the procedure?  You will wake up, often slowly, after the procedure is complete, usually in a recovery area.  Your blood pressure, heart rate, breathing rate, and blood oxygen level will be monitored until the medicines you were given have worn off.  You may be given medicine to help you calm down if you feel anxious or agitated.  If you will be going home the same day, your health care provider may check to make sure you can stand, drink, and urinate.  Your health care providers will treat your pain and side effects before you go home.  Do not drive for 24 hours if you received a sedative.  You may:  Feel nauseous and vomit.  Have a sore throat.  Have mental slowness.  Feel cold or shivery.  Feel sleepy.  Feel tired.  Feel sore or achy, even in parts of your body where you did not have surgery. This information is not intended to replace advice given to you by your health care provider. Make sure you discuss any questions you have with your health care provider. Document Released: 11/06/2007 Document Revised: 01/10/2016 Document Reviewed: 07/14/2015 Elsevier Interactive Patient Education  2017 [place] Anesthesia, Adult, Care After These instructions provide you with information about caring for yourself after your procedure. Your health care provider may also give you more specific  instructions. Your treatment has been planned according to current medical practices, but problems sometimes occur. Call your health care provider if you have any problems or questions after your procedure. What can I expect after the procedure? After the procedure, it is common to have:  Vomiting.  A sore throat.  Mental slowness. It is common to feel:  Nauseous.  Cold or shivery.  Sleepy.  Tired.  Sore or achy, even in parts of your body where you did not have surgery. Follow these instructions at home: For at least 24 hours after the procedure:   Do not:  Participate in activities where you could fall or become injured.  Drive.  Use heavy machinery.  Drink alcohol.  Take sleeping pills or medicines that cause drowsiness.  Make important decisions or sign legal documents.  Take care of children on your own.  Rest. Eating and drinking   If you vomit, drink water, juice, or soup when you can drink without vomiting.  Drink enough fluid to keep your urine clear or pale yellow.  Make sure you have little or no nausea before eating solid foods.  Follow the diet recommended by your health care provider. General instructions   Have a responsible adult stay with you until you are awake and alert.  Return to your normal activities as told by your health care provider. Ask your health care provider what activities are safe for you.  Take over-the-counter and prescription medicines only as told by your health care provider.  If you smoke, do not smoke without supervision.  Keep all follow-up visits as told by your health care provider. This is important. Contact a health care provider if:  You continue to have nausea or vomiting at home, and medicines are not helpful.  You cannot drink fluids or start eating again.  You cannot urinate after 8-12 hours.  You develop a skin rash.  You have fever.  You have increasing redness at the site of your  procedure. Get help right away if:  You have difficulty breathing.  You have chest pain.  You have unexpected bleeding.  You feel that you are having a life-threatening or urgent problem. This information is not intended to replace advice given to you by your health care provider. Make sure you discuss any questions you have with your health care provider. Document Released: 11/05/2000 Document Revised: 01/02/2016 Document Reviewed: 07/14/2015 Elsevier Interactive Patient Education  2017 [employer].

## 2016-11-30 ENCOUNTER — Other Ambulatory Visit (HOSPITAL_COMMUNITY): Payer: BLUE CROSS/BLUE SHIELD

## 2016-12-03 ENCOUNTER — Encounter (HOSPITAL_COMMUNITY)
Admission: RE | Admit: 2016-12-03 | Discharge: 2016-12-03 | Disposition: A | Discharge status: Home / Self Care | Payer: BLUE CROSS/BLUE SHIELD | Source: Ambulatory Visit | Attending: Obstetrics & Gynecology | Admitting: Obstetrics & Gynecology

## 2016-12-03 ENCOUNTER — Encounter (HOSPITAL_COMMUNITY): Payer: Self-pay

## 2016-12-03 ENCOUNTER — Other Ambulatory Visit: Payer: Self-pay | Admitting: Obstetrics & Gynecology

## 2016-12-03 DIAGNOSIS — Z01812 Encounter for preprocedural laboratory examination: Secondary | ICD-10-CM | POA: Insufficient documentation

## 2016-12-03 HISTORY — DX: Anemia, unspecified: D64.9

## 2016-12-03 HISTORY — DX: Unspecified osteoarthritis, unspecified site: M19.90

## 2016-12-03 LAB — COMPREHENSIVE METABOLIC PANEL
ALBUMIN: 4.1 g/dL (ref 3.5–5.0)
ALK PHOS: 79 U/L (ref 38–126)
ALT: 37 U/L (ref 14–54)
ANION GAP: 10 (ref 5–15)
AST: 52 U/L — AB (ref 15–41)
BILIRUBIN TOTAL: 0.5 mg/dL (ref 0.3–1.2)
BUN: 9 mg/dL (ref 6–20)
CALCIUM: 9.1 mg/dL (ref 8.9–10.3)
CO2: 25 mmol/L (ref 22–32)
Chloride: 103 mmol/L (ref 101–111)
Creatinine, Ser: 0.67 mg/dL (ref 0.44–1.00)
GFR calc Af Amer: >60 mL/min (ref >60)
GFR calc non Af Amer: >60 mL/min (ref >60)
GLUCOSE: 159 mg/dL — AB (ref 65–99)
Potassium: 3.4 mmol/L — ABNORMAL LOW (ref 3.5–5.1)
SODIUM: 138 mmol/L (ref 135–145)
TOTAL PROTEIN: 8.2 g/dL — AB (ref 6.5–8.1)

## 2016-12-03 LAB — CBC
HEMATOCRIT: 34.3 % — AB (ref 36.0–46.0)
HEMOGLOBIN: 10.7 g/dL — AB (ref 12.0–15.0)
MCH: 26.2 pg (ref 26.0–34.0)
MCHC: 31.2 g/dL (ref 30.0–36.0)
MCV: 84.1 fL (ref 78.0–100.0)
Platelets: 482 10*3/uL — ABNORMAL HIGH (ref 150–400)
RBC: 4.08 MIL/uL (ref 3.87–5.11)
RDW: 19.8 % — ABNORMAL HIGH (ref 11.5–15.5)
WBC: 7.4 10*3/uL (ref 4.0–10.5)

## 2016-12-03 LAB — SURGICAL PCR SCREEN
MRSA, PCR: NEGATIVE
STAPHYLOCOCCUS AUREUS: NEGATIVE

## 2016-12-03 NOTE — Pre-Procedure Instructions (Signed)
Patient could not void to do urinalysis. Not enough blood for HCG. Will do UA and urine pregnancy on arrival 12/05/2016.

## 2016-12-04 DIAGNOSIS — Z029 Encounter for administrative examinations, unspecified: Secondary | ICD-10-CM

## 2016-12-04 LAB — TYPE AND SCREEN
ABO/RH(D): O NEG
ANTIBODY SCREEN: NEGATIVE

## 2016-12-05 ENCOUNTER — Encounter (HOSPITAL_COMMUNITY): Payer: Self-pay | Admitting: *Deleted

## 2016-12-05 ENCOUNTER — Inpatient Hospital Stay (HOSPITAL_COMMUNITY)
Admission: RE | Admit: 2016-12-05 | Discharge: 2016-12-06 | DRG: 743 | Disposition: A | Discharge status: Home / Self Care | Payer: BLUE CROSS/BLUE SHIELD | Source: Ambulatory Visit | Attending: Obstetrics & Gynecology | Admitting: Obstetrics & Gynecology

## 2016-12-05 ENCOUNTER — Inpatient Hospital Stay (HOSPITAL_COMMUNITY): Payer: BLUE CROSS/BLUE SHIELD | Admitting: Anesthesiology

## 2016-12-05 ENCOUNTER — Encounter (HOSPITAL_COMMUNITY)
Admission: RE | Disposition: A | Discharge status: Home / Self Care | Payer: Self-pay | Source: Ambulatory Visit | Attending: Obstetrics & Gynecology

## 2016-12-05 DIAGNOSIS — Z79899 Other long term (current) drug therapy: Secondary | ICD-10-CM

## 2016-12-05 DIAGNOSIS — Z7982 Long term (current) use of aspirin: Secondary | ICD-10-CM

## 2016-12-05 DIAGNOSIS — Z90722 Acquired absence of ovaries, bilateral: Secondary | ICD-10-CM

## 2016-12-05 DIAGNOSIS — D271 Benign neoplasm of left ovary: Principal | ICD-10-CM | POA: Diagnosis present

## 2016-12-05 DIAGNOSIS — Z79891 Long term (current) use of opiate analgesic: Secondary | ICD-10-CM

## 2016-12-05 DIAGNOSIS — D649 Anemia, unspecified: Secondary | ICD-10-CM | POA: Diagnosis not present

## 2016-12-05 DIAGNOSIS — D509 Iron deficiency anemia, unspecified: Secondary | ICD-10-CM | POA: Diagnosis present

## 2016-12-05 DIAGNOSIS — Z791 Long term (current) use of non-steroidal anti-inflammatories (NSAID): Secondary | ICD-10-CM

## 2016-12-05 DIAGNOSIS — Z90710 Acquired absence of both cervix and uterus: Secondary | ICD-10-CM

## 2016-12-05 DIAGNOSIS — N946 Dysmenorrhea, unspecified: Secondary | ICD-10-CM | POA: Diagnosis present

## 2016-12-05 DIAGNOSIS — Z9079 Acquired absence of other genital organ(s): Secondary | ICD-10-CM

## 2016-12-05 DIAGNOSIS — N921 Excessive and frequent menstruation with irregular cycle: Secondary | ICD-10-CM | POA: Diagnosis present

## 2016-12-05 DIAGNOSIS — Z885 Allergy status to narcotic agent status: Secondary | ICD-10-CM | POA: Diagnosis not present

## 2016-12-05 DIAGNOSIS — D259 Leiomyoma of uterus, unspecified: Secondary | ICD-10-CM | POA: Diagnosis present

## 2016-12-05 DIAGNOSIS — R102 Pelvic and perineal pain: Secondary | ICD-10-CM | POA: Diagnosis present

## 2016-12-05 HISTORY — PX: ABDOMINAL HYSTERECTOMY: SHX81

## 2016-12-05 HISTORY — PX: SALPINGOOPHORECTOMY: SHX82

## 2016-12-05 LAB — URINALYSIS, ROUTINE W REFLEX MICROSCOPIC
Bilirubin Urine: NEGATIVE
GLUCOSE, UA: NEGATIVE mg/dL
Hgb urine dipstick: NEGATIVE
Ketones, ur: NEGATIVE mg/dL
LEUKOCYTES UA: NEGATIVE
NITRITE: NEGATIVE
PH: 5 (ref 5.0–8.0)
PROTEIN: NEGATIVE mg/dL
Specific Gravity, Urine: 1.016 (ref 1.005–1.030)

## 2016-12-05 LAB — PREGNANCY, URINE: Preg Test, Ur: NEGATIVE

## 2016-12-05 SURGERY — HYSTERECTOMY, ABDOMINAL
Anesthesia: General | Site: Abdomen

## 2016-12-05 MED ORDER — SODIUM CHLORIDE 0.9 % IV SOLN
8.0000 mg | Freq: Four times a day (QID) | INTRAVENOUS | Status: DC | PRN
  Filled 2016-12-05: qty 4

## 2016-12-05 MED ORDER — KETOROLAC TROMETHAMINE 30 MG/ML IJ SOLN
INTRAMUSCULAR | Status: AC
  Filled 2016-12-05: qty 1

## 2016-12-05 MED ORDER — LIDOCAINE HCL (PF) 1 % IJ SOLN
INTRAMUSCULAR | Status: AC
  Filled 2016-12-05: qty 5

## 2016-12-05 MED ORDER — ROCURONIUM BROMIDE 50 MG/5ML IV SOLN
INTRAVENOUS | Status: AC
  Filled 2016-12-05: qty 1

## 2016-12-05 MED ORDER — PROPOFOL 10 MG/ML IV BOLUS
INTRAVENOUS | Status: DC | PRN
  Administered 2016-12-05: 150 mg via INTRAVENOUS

## 2016-12-05 MED ORDER — NEOSTIGMINE METHYLSULFATE 10 MG/10ML IV SOLN
INTRAVENOUS | Status: AC
  Filled 2016-12-05: qty 1

## 2016-12-05 MED ORDER — ZOLPIDEM TARTRATE 5 MG PO TABS: 5.0000 mg | ORAL_TABLET | Freq: Every evening | ORAL | Status: DC | PRN

## 2016-12-05 MED ORDER — ONDANSETRON HCL 4 MG/2ML IJ SOLN
4.0000 mg | Freq: Once | INTRAMUSCULAR | Status: AC
  Administered 2016-12-05: 4 mg via INTRAVENOUS

## 2016-12-05 MED ORDER — SENNOSIDES-DOCUSATE SODIUM 8.6-50 MG PO TABS: 1.0000 | ORAL_TABLET | Freq: Every evening | ORAL | Status: DC | PRN

## 2016-12-05 MED ORDER — KETOROLAC TROMETHAMINE 30 MG/ML IJ SOLN
INTRAMUSCULAR | Status: DC | PRN
  Administered 2016-12-05: 30 mg via INTRAVENOUS

## 2016-12-05 MED ORDER — SODIUM CHLORIDE 0.9 % IR SOLN
Status: DC | PRN
  Administered 2016-12-05: 2000 mL

## 2016-12-05 MED ORDER — ALUM & MAG HYDROXIDE-SIMETH 200-200-20 MG/5ML PO SUSP: 30.0000 mL | ORAL | Status: DC | PRN

## 2016-12-05 MED ORDER — PROPOFOL 10 MG/ML IV BOLUS
INTRAVENOUS | Status: AC
  Filled 2016-12-05: qty 20

## 2016-12-05 MED ORDER — MIDAZOLAM HCL 2 MG/2ML IJ SOLN
1.0000 mg | INTRAMUSCULAR | Status: AC
  Administered 2016-12-05 (×2): 1 mg via INTRAVENOUS

## 2016-12-05 MED ORDER — LIDOCAINE HCL (CARDIAC) 10 MG/ML IV SOLN
INTRAVENOUS | Status: DC | PRN
  Administered 2016-12-05: 40 mg via INTRAVENOUS

## 2016-12-05 MED ORDER — ROCURONIUM 10MG/ML (10ML) SYRINGE FOR MEDFUSION PUMP - OPTIME
INTRAVENOUS | Status: DC | PRN
  Administered 2016-12-05: 5 mg via INTRAVENOUS
  Administered 2016-12-05: 35 mg via INTRAVENOUS
  Administered 2016-12-05: 15 mg via INTRAVENOUS

## 2016-12-05 MED ORDER — BISACODYL 10 MG RE SUPP: 10.0000 mg | Freq: Every day | RECTAL | Status: DC | PRN

## 2016-12-05 MED ORDER — KCL IN DEXTROSE-NACL 40-5-0.45 MEQ/L-%-% IV SOLN
INTRAVENOUS | Status: DC
  Administered 2016-12-05 – 2016-12-06 (×3): via INTRAVENOUS

## 2016-12-05 MED ORDER — DOCUSATE SODIUM 100 MG PO CAPS
100.0000 mg | ORAL_CAPSULE | Freq: Two times a day (BID) | ORAL | Status: DC
  Administered 2016-12-05 – 2016-12-06 (×3): 100 mg via ORAL
  Filled 2016-12-05 (×3): qty 1

## 2016-12-05 MED ORDER — PROMETHAZINE HCL 12.5 MG PO TABS
25.0000 mg | ORAL_TABLET | Freq: Four times a day (QID) | ORAL | Status: DC | PRN
  Administered 2016-12-05: 25 mg via ORAL
  Filled 2016-12-05 (×2): qty 2

## 2016-12-05 MED ORDER — NEOSTIGMINE METHYLSULFATE 10 MG/10ML IV SOLN
INTRAVENOUS | Status: DC | PRN
  Administered 2016-12-05: 4 mg via INTRAVENOUS

## 2016-12-05 MED ORDER — FENTANYL CITRATE (PF) 100 MCG/2ML IJ SOLN
INTRAMUSCULAR | Status: DC | PRN
  Administered 2016-12-05 (×5): 50 ug via INTRAVENOUS

## 2016-12-05 MED ORDER — GLYCOPYRROLATE 0.2 MG/ML IJ SOLN
INTRAMUSCULAR | Status: AC
  Filled 2016-12-05: qty 1

## 2016-12-05 MED ORDER — ONDANSETRON HCL 4 MG PO TABS
8.0000 mg | ORAL_TABLET | Freq: Four times a day (QID) | ORAL | Status: DC | PRN
  Administered 2016-12-05: 8 mg via ORAL
  Filled 2016-12-05: qty 2

## 2016-12-05 MED ORDER — HYDROMORPHONE HCL 1 MG/ML IJ SOLN
1.0000 mg | INTRAMUSCULAR | Status: DC | PRN
  Administered 2016-12-05: 1 mg via INTRAVENOUS
  Administered 2016-12-05 (×2): 2 mg via INTRAVENOUS
  Administered 2016-12-05 – 2016-12-06 (×3): 1 mg via INTRAVENOUS
  Filled 2016-12-05: qty 1
  Filled 2016-12-05 (×3): qty 2
  Filled 2016-12-05 (×2): qty 1

## 2016-12-05 MED ORDER — GLYCOPYRROLATE 0.2 MG/ML IJ SOLN
INTRAMUSCULAR | Status: DC | PRN
  Administered 2016-12-05: .7 mg via INTRAVENOUS

## 2016-12-05 MED ORDER — HEMOSTATIC AGENTS (NO CHARGE) OPTIME
TOPICAL | Status: DC | PRN
  Administered 2016-12-05: 1 via TOPICAL

## 2016-12-05 MED ORDER — BUPIVACAINE LIPOSOME 1.3 % IJ SUSP
INTRAMUSCULAR | Status: DC | PRN
  Administered 2016-12-05: 20 mL

## 2016-12-05 MED ORDER — FENTANYL CITRATE (PF) 250 MCG/5ML IJ SOLN
INTRAMUSCULAR | Status: AC
  Filled 2016-12-05: qty 5

## 2016-12-05 MED ORDER — CEFAZOLIN SODIUM-DEXTROSE 2-4 GM/100ML-% IV SOLN
2.0000 g | INTRAVENOUS | Status: AC
  Administered 2016-12-05: 2 g via INTRAVENOUS
  Filled 2016-12-05: qty 100

## 2016-12-05 MED ORDER — BUPIVACAINE LIPOSOME 1.3 % IJ SUSP
20.0000 mL | Freq: Once | INTRAMUSCULAR | Status: DC
  Filled 2016-12-05: qty 20

## 2016-12-05 MED ORDER — BUPIVACAINE LIPOSOME 1.3 % IJ SUSP
INTRAMUSCULAR | Status: AC
  Filled 2016-12-05: qty 20

## 2016-12-05 MED ORDER — OXYCODONE-ACETAMINOPHEN 5-325 MG PO TABS
1.0000 | ORAL_TABLET | ORAL | Status: DC | PRN
  Administered 2016-12-06 (×2): 1 via ORAL
  Filled 2016-12-05 (×2): qty 1

## 2016-12-05 MED ORDER — LACTATED RINGERS IV SOLN
INTRAVENOUS | Status: DC
  Administered 2016-12-05 (×2): via INTRAVENOUS

## 2016-12-05 MED ORDER — IPRATROPIUM-ALBUTEROL 0.5-2.5 (3) MG/3ML IN SOLN
RESPIRATORY_TRACT | Status: AC
  Filled 2016-12-05: qty 3

## 2016-12-05 MED ORDER — HYDROMORPHONE HCL 1 MG/ML IJ SOLN
0.2500 mg | INTRAMUSCULAR | Status: DC | PRN
  Administered 2016-12-05 (×2): 0.5 mg via INTRAVENOUS
  Filled 2016-12-05: qty 1

## 2016-12-05 MED ORDER — GLYCOPYRROLATE 0.2 MG/ML IJ SOLN
INTRAMUSCULAR | Status: AC
  Filled 2016-12-05: qty 3

## 2016-12-05 MED ORDER — MIDAZOLAM HCL 2 MG/2ML IJ SOLN
INTRAMUSCULAR | Status: AC
Start: 2016-12-05 — End: 2016-12-05
  Filled 2016-12-05: qty 2

## 2016-12-05 MED ORDER — ONDANSETRON HCL 4 MG/2ML IJ SOLN
INTRAMUSCULAR | Status: AC
  Filled 2016-12-05: qty 2

## 2016-12-05 SURGICAL SUPPLY — 48 items
APPLIER CLIP 13 LRG OPEN (CLIP)
BAG HAMPER (MISCELLANEOUS) ×4 IMPLANT
CELLS DAT CNTRL 66122 CELL SVR (MISCELLANEOUS) IMPLANT
CLIP APPLIE 13 LRG OPEN (CLIP) IMPLANT
CLOTH BEACON ORANGE TIMEOUT ST (SAFETY) ×4 IMPLANT
COVER LIGHT HANDLE STERIS (MISCELLANEOUS) ×8 IMPLANT
DERMABOND ADVANCED (GAUZE/BANDAGES/DRESSINGS) ×2
DERMABOND ADVANCED .7 DNX12 (GAUZE/BANDAGES/DRESSINGS) ×2 IMPLANT
DRAPE WARM FLUID 44X44 (DRAPE) ×4 IMPLANT
DRSG OPSITE POSTOP 4X10 (GAUZE/BANDAGES/DRESSINGS) ×4 IMPLANT
DRSG OPSITE POSTOP 4X8 (GAUZE/BANDAGES/DRESSINGS) IMPLANT
DRSG TELFA 3X8 NADH (GAUZE/BANDAGES/DRESSINGS) IMPLANT
ELECT REM PT RETURN 9FT ADLT (ELECTROSURGICAL) ×4
ELECTRODE REM PT RTRN 9FT ADLT (ELECTROSURGICAL) ×2 IMPLANT
FORMALIN 10 PREFIL 120ML (MISCELLANEOUS) ×4 IMPLANT
FORMALIN 10 PREFIL 480ML (MISCELLANEOUS) ×4 IMPLANT
GLOVE BIOGEL PI IND STRL 7.0 (GLOVE) ×4 IMPLANT
GLOVE BIOGEL PI IND STRL 8 (GLOVE) ×2 IMPLANT
GLOVE BIOGEL PI INDICATOR 7.0 (GLOVE) ×4
GLOVE BIOGEL PI INDICATOR 8 (GLOVE) ×2
GLOVE ECLIPSE 8.0 STRL XLNG CF (GLOVE) ×4 IMPLANT
GOWN STRL REUS W/TWL LRG LVL3 (GOWN DISPOSABLE) ×8 IMPLANT
GOWN STRL REUS W/TWL XL LVL3 (GOWN DISPOSABLE) ×4 IMPLANT
HEMOSTAT ARISTA ABSORB 3G PWDR (MISCELLANEOUS) ×4 IMPLANT
INST SET MAJOR GENERAL (KITS) ×4 IMPLANT
KIT ROOM TURNOVER APOR (KITS) ×4 IMPLANT
MANIFOLD NEPTUNE II (INSTRUMENTS) ×4 IMPLANT
NEEDLE HYPO 21X1.5 SAFETY (NEEDLE) ×4 IMPLANT
NS IRRIG 1000ML POUR BTL (IV SOLUTION) ×8 IMPLANT
PACK ABDOMINAL MAJOR (CUSTOM PROCEDURE TRAY) ×4 IMPLANT
PAD ARMBOARD 7.5X6 YLW CONV (MISCELLANEOUS) ×4 IMPLANT
RETRACTOR WND ALEXIS 25 LRG (MISCELLANEOUS) ×2 IMPLANT
RTRCTR WOUND ALEXIS 18CM MED (MISCELLANEOUS)
RTRCTR WOUND ALEXIS 25CM LRG (MISCELLANEOUS) ×4
SET BASIN LINEN APH (SET/KITS/TRAYS/PACK) ×4 IMPLANT
STAPLER VISISTAT 35W (STAPLE) IMPLANT
SUT CHROMIC 0 CT 1 (SUTURE) ×4 IMPLANT
SUT MON AB 3-0 SH 27 (SUTURE) IMPLANT
SUT PLAIN 2 0 XLH (SUTURE) ×4 IMPLANT
SUT VIC AB 0 CT1 27 (SUTURE) ×6
SUT VIC AB 0 CT1 27XBRD ANTBC (SUTURE) IMPLANT
SUT VIC AB 0 CT1 27XCR 8 STRN (SUTURE) ×6 IMPLANT
SUT VIC AB 0 CTX 36 (SUTURE) ×2
SUT VIC AB 0 CTX36XBRD ANTBCTR (SUTURE) ×2 IMPLANT
SUT VICRYL 3 0 (SUTURE) ×8 IMPLANT
SYR 20CC LL (SYRINGE) ×4 IMPLANT
TOWEL BLUE STERILE X RAY DET (MISCELLANEOUS) ×4 IMPLANT
TRAY FOLEY W/METER SILVER 16FR (SET/KITS/TRAYS/PACK) ×4 IMPLANT

## 2016-12-05 NOTE — Anesthesia Procedure Notes (Signed)
Procedure Name: Intubation Performed by: Ollen Bowl Pre-anesthesia Checklist: Patient identified, Patient being monitored, Timeout performed, Emergency Drugs available and Suction available Patient Re-evaluated:Patient Re-evaluated prior to inductionOxygen Delivery Method: Circle system utilized Preoxygenation: Pre-oxygenation with 100% oxygen Intubation Type: IV induction Ventilation: Mask ventilation without difficulty Laryngoscope Size: Mac and 3 Grade View: Grade I Tube type: Oral Tube size: 7.0 mm Number of attempts: 1 Airway Equipment and Method: Stylet Placement Confirmation: ETT inserted through vocal cords under direct vision,  positive ETCO2 and breath sounds checked- equal and bilateral Secured at: 21 cm Tube secured with: Tape Dental Injury: Teeth and Oropharynx as per pre-operative assessment

## 2016-12-05 NOTE — Anesthesia Postprocedure Evaluation (Signed)
Anesthesia Post Note  Patient: Mallory Neal  Procedure(s) Performed: Procedure(s) (LRB): HYSTERECTOMY ABDOMINAL (N/A) SALPINGO OOPHORECTOMY (Bilateral)  Patient location during evaluation: PACU Anesthesia Type: General Level of consciousness: awake and alert and oriented Pain management: pain level controlled Vital Signs Assessment: post-procedure vital signs reviewed and stable Respiratory status: spontaneous breathing and patient connected to nasal cannula oxygen Cardiovascular status: blood pressure returned to baseline Postop Assessment: no signs of nausea or vomiting Anesthetic complications: no     Last Vitals:  Vitals:   12/05/16 1115 12/05/16 1120  BP: 114/70   Pulse: (!) 53 (!) 52  Resp: 18 17  Temp:      Last Pain:  Vitals:   12/05/16 1115  TempSrc:   PainSc: Asleep                 [place] Souter

## 2016-12-05 NOTE — Interval H&P Note (Signed)
History and Physical Interval Note:  12/05/2016 8:07 AM  Mallory Neal  has presented today for surgery, with the diagnosis of Fibroids Anemia Heavy Menstral Cycle  The various methods of treatment have been discussed with the patient and family. After consideration of risks, benefits and other options for treatment, the patient has consented to  Procedure(s): HYSTERECTOMY ABDOMINAL (N/A) SALPINGO OOPHORECTOMY (Bilateral) as a surgical intervention .  The patient's history has been reviewed, patient examined, no change in status, stable for surgery.  I have reviewed the patient's chart and labs.  Questions were answered to the patient's satisfaction.     Canyon Willow H

## 2016-12-05 NOTE — Anesthesia Preprocedure Evaluation (Signed)
Anesthesia Evaluation  Patient identified by MRN, date of birth, ID band Patient awake    Reviewed: Allergy & Precautions, NPO status , Patient's Chart, lab work & pertinent test results  Airway Mallampati: II  TM Distance: >3 FB Neck ROM: Full    Dental  (+) Teeth Intact   Pulmonary neg pulmonary ROS,    breath sounds clear to auscultation       Cardiovascular negative cardio ROS   Rhythm:Regular Rate:Normal     Neuro/Psych negative neurological ROS  negative psych ROS   GI/Hepatic negative GI ROS,   Endo/Other    Renal/GU      Musculoskeletal   Abdominal   Peds  Hematology  (+) anemia ,   Anesthesia Other Findings   Reproductive/Obstetrics                             Anesthesia Physical Anesthesia Plan  ASA: II  Anesthesia Plan: General   Post-op Pain Management:    Induction: Intravenous  Airway Management Planned: Oral ETT  Additional Equipment:   Intra-op Plan:   Post-operative Plan: Extubation in OR  Informed Consent: I have reviewed the patients History and Physical, chart, labs and discussed the procedure including the risks, benefits and alternatives for the proposed anesthesia with the patient or authorized representative who has indicated his/her understanding and acceptance.     Plan Discussed with:   Anesthesia Plan Comments:         Anesthesia Quick Evaluation

## 2016-12-05 NOTE — Op Note (Signed)
Preoperative diagnosis:  1.  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  menometrorrhagia                                         3.  dysmenorrhea                                         4.  Anemia                                         5.  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  She was then prepped and draped in the usual sterile fashion and a Foley catheter was placed for continuous bladder drainage.  A Pfannenstiel skin incision was made and carried down sharply to the rectus fascia which was scored in the midline and extended laterally.  The fascia was taken off the muscles superiorly and inferiorly without difficulty.  The muscles were divided.  The peritoneal cavity was entered.  The mass palpated on exam and seen on both CT and sonogram actually arose from the left ovary and, given the appearance of the mass on imaging, I suspect it is an ovarian fibroma.  The infundibulopelvic ligament was cross clamped and the left ovarian mas and tubew were removed in total and sent to  pathology separately. An large Alexis self-retaining retractor was placed.  The upper abdomen was packed away. Both uterine cornu were grasped with Coker clamps.  The left round ligament was suture ligated and coagulated with the electrocautery unit.  The left vesicouterine serosal flap was created.    The right round ligament was suture ligated and cut with the electrocautery unit.  The vesicouterine serosal flap on the right was created.  An avascular window in the peritoneum was created and the right infundibulopelvic ligament was cross clamped, cut and double suture ligated.  Thus both ovaries were removed per the preoperative plan..  The uterine vessels were skeletonized bilaterally.  The uterine vessels were clamped bilaterally,  then cut and suture ligated.  Several  more pedicles were taken down the cervix medial to the uterine vessels.  Each pedicle was clamped cut and suture ligated with good resulting hemostasis.  The vagina was cross clamped and the uterus and cervix were removed in total per the preoperative plan.  The pelvis was irrigated vigorously and all pedicles were examined and found to be hemostatic.  All specimens were sent to pathology for routine evaluation.  The Alexis self-retaining retractor was removed and the pelvis was irrigated vigorously.  All packs were removed and all counts were correct at this point x 3.  The muscles and peritoneum were reapproximated loosely.  The fascia was closed with 0 Vicryl running.  The subcutaneous tissue was reapproximated using 2-0 plain gut.  The skin was closed using 3-0 Vicryl on a Keith needle in a subcuticular fashion.  Dermabond was then applied for additional wound integrity and to serve as a postoperative bacterial barrier.  The patient was awakened from anesthesia taken to the recovery room in good stable condition. All sponge instrument and needle counts were correct x 3.  The patient received Ancef and Toradol prophylactically preoperatively.   Estimated blood loss for the procedure was 100  cc.  Specimen:  Uterus and cervix, left tube and ovary(mass), right tube and ovary  Mallory Neal,Mallory Neal 12/05/2016 10:18 AM

## 2016-12-05 NOTE — Transfer of Care (Signed)
Immediate Anesthesia Transfer of Care Note  Patient: Mallory Neal  Procedure(s) Performed: Procedure(s): HYSTERECTOMY ABDOMINAL (N/A) SALPINGO OOPHORECTOMY (Bilateral)  Patient Location: PACU  Anesthesia Type:General  Level of Consciousness: awake  Airway & Oxygen Therapy: Patient Spontanous Breathing and Patient connected to face mask oxygen  Post-op Assessment: Report given to RN  Post vital signs: Reviewed and stable  Last Vitals:  Vitals:   12/05/16 0810 12/05/16 0811  BP:    Resp: (!) 23 (!) 25  Temp:      Last Pain:  Vitals:   12/05/16 0728  TempSrc: Oral  PainSc: 3       Patients Stated Pain Goal: 10 (03/54/65 6812)  Complications: No apparent anesthesia complications

## 2016-12-05 NOTE — H&P (Signed)
Preoperative History and Physical  Mallory Neal is a 45 y.o. G2P2 with No LMP recorded (lmp unknown). admitted for a TAH BSO.  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 Chest pain   . Heart murmur     PSH:          Past Surgical History:  Procedure Laterality Date  . CHOLECYSTECTOMY      POb/GynH:              OB History    Gravida Para Term Preterm AB Living   2 2           SAB TAB Ectopic Multiple Live Births                  SH:          Social History   Substance Use Topics   . Smoking status: Never Smoker   . Smokeless tobacco: Never Used   . Alcohol use Yes     Comment: occ     FH:          Family History  Problem Relation Age of Onset  . Diabetes Paternal Grandfather   . Heart disease Paternal Grandmother   . Heart disease Maternal Grandmother   . Cancer Father     lung     Allergies:      Allergies  Allergen Reactions  . Codeine Hives, Shortness Of Breath and Rash    Medications:       Current Outpatient Prescriptions:  .  aspirin EC 81 MG tablet, Take 81 mg by mouth once., Disp: , Rfl:  .  ibuprofen (ADVIL,MOTRIN) 800 MG tablet, Take 1 tablet (800 mg total) by mouth 3 (three) times daily., Disp: 21 tablet, Rfl: 0 .  oxyCODONE-acetaminophen (PERCOCET) 5-325 MG tablet, Take 1-2 tablets by mouth every 4 (four) hours as needed., Disp: 15 tablet, Rfl: 0 .  promethazine (PHENERGAN) 25 MG tablet, Take 1 tablet (25 mg total) by mouth every 6 (six) hours as needed for nausea or vomiting., Disp: 30 tablet, Rfl: 0 .  traMADol (ULTRAM) 50 MG tablet, Take 1 tablet (50 mg total) by  mouth every 6 (six) hours as needed., Disp: 8 tablet, Rfl: 0 .  naproxen (NAPROSYN) 500 MG tablet, Take 1 po BID with food prn pain (Patient not taking: Reported on 11/15/2016), Disp: 30 tablet, Rfl: 0  Review of Systems:   Review of Systems  Constitutional: Negative for fever, chills, weight loss, malaise/fatigue and diaphoresis.  HENT: Negative for hearing loss, ear pain, nosebleeds, congestion, sore throat, neck pain, tinnitus and ear discharge.   Eyes: Negative for blurred vision, double vision, photophobia, pain, discharge and redness.  Respiratory: Negative for cough, hemoptysis, sputum production, shortness of breath, wheezing and stridor.   Cardiovascular: Negative for chest pain, palpitations, orthopnea, claudication, leg swelling and PND.  Gastrointestinal: Positive for abdominal pain. Negative for heartburn, nausea, vomiting, diarrhea, constipation, blood in stool and melena.  Genitourinary: Negative for dysuria, urgency, frequency, hematuria and flank pain.  Musculoskeletal: Negative for myalgias, back pain, joint pain and falls.  Skin: Negative for itching and rash.  Neurological: Negative for dizziness, tingling, tremors, sensory change, speech change, focal weakness, seizures, loss of consciousness, weakness and headaches.  Endo/Heme/Allergies: Negative for environmental allergies and polydipsia. Does not bruise/bleed easily.  Psychiatric/Behavioral: Negative for depression, suicidal ideas, hallucinations, memory loss and substance abuse. The patient is not nervous/anxious and does not have insomnia.      PHYSICAL EXAM:  Blood pressure 134/84, pulse 98, height 5\' 6"  (1.676 m), weight 214 lb (97.1 kg).    Vitals reviewed. Constitutional: She is oriented to person, place, and time. She appears well-developed and well-nourished.  HENT:  Head: Normocephalic and atraumatic.  Right Ear: External ear normal.  Left Ear: External ear normal.  Nose: Nose normal.   Mouth/Throat: Oropharynx is clear and moist.  Eyes: Conjunctivae and EOM are normal. Pupils are equal, round, and reactive to light. Right eye exhibits no discharge. Left eye exhibits no discharge. No scleral icterus.  Neck: Normal range of motion. Neck supple. No tracheal deviation present. No thyromegaly present.  Cardiovascular: Normal rate, regular rhythm, normal heart sounds and intact distal pulses.  Exam reveals no gallop and no friction rub.   No murmur heard. Respiratory: Effort normal and breath sounds normal. No respiratory distress. She has no wheezes. She has no rales. She exhibits no tenderness.  GI: Soft. Bowel sounds are normal. She exhibits no distension and no mass. There is tenderness. There is no rebound and no guarding.  Genitourinary:       Vulva is normal without lesions Vagina is pink moist without discharge Cervix normal in appearance and pap is normal Uterus is enlarged overall size with pedunculated myoma is 20 weeks size(to the umbilicus) Adnexa is negative with normal sized ovaries by sonogram  Musculoskeletal: Normal range of motion. She exhibits no edema and no tenderness.  Neurological: She is alert and oriented to person, place, and time. She has normal reflexes. She displays normal reflexes. No cranial nerve deficit. She exhibits normal muscle tone. Coordination normal.  Skin: Skin is warm and dry. No rash noted. No erythema. No pallor.  Psychiatric: She has a normal mood and affect. Her behavior is normal. Judgment and thought content normal.    Labs:  Results for orders placed or performed during the hospital encounter of 12/05/16 (from the past 168 hour(s))  Pregnancy, urine   Collection Time: 12/05/16  7:10 AM  Result Value Ref Range   Preg Test, Ur NEGATIVE NEGATIVE  Urinalysis, Routine w reflex microscopic   Collection Time: 12/05/16  7:10 AM  Result Value Ref Range   Color, Urine YELLOW YELLOW   APPearance HAZY (A) CLEAR   Specific Gravity,  Urine 1.016 1.005 - 1.030   pH 5.0 5.0 - 8.0   Glucose, UA NEGATIVE NEGATIVE mg/dL   Hgb urine dipstick NEGATIVE NEGATIVE   Bilirubin Urine NEGATIVE NEGATIVE   Ketones, ur NEGATIVE NEGATIVE mg/dL   Protein, ur NEGATIVE NEGATIVE mg/dL   Nitrite NEGATIVE NEGATIVE   Leukocytes, UA NEGATIVE NEGATIVE  Results for orders placed or performed during the hospital encounter of 12/03/16 (from the past 168 hour(s))  Surgical pcr screen   Collection Time: 12/03/16  1:13 PM  Result Value Ref Range   MRSA, PCR NEGATIVE NEGATIVE   Staphylococcus aureus NEGATIVE NEGATIVE  CBC   Collection Time: 12/03/16  1:13 PM  Result Value Ref Range   WBC 7.4 4.0 - 10.5 K/uL   RBC 4.08 3.87 - 5.11 MIL/uL   Hemoglobin 10.7 (L) 12.0 - 15.0 g/dL   HCT 34.3 (L) 36.0 -  46.0 %   MCV 84.1 78.0 - 100.0 fL   MCH 26.2 26.0 - 34.0 pg   MCHC 31.2 30.0 - 36.0 g/dL   RDW 19.8 (H) 11.5 - 15.5 %   Platelets 482 (H) 150 - 400 K/uL  Comprehensive metabolic panel   Collection Time: 12/03/16  1:13 PM  Result Value Ref Range   Sodium 138 135 - 145 mmol/L   Potassium 3.4 (L) 3.5 - 5.1 mmol/L   Chloride 103 101 - 111 mmol/L   CO2 25 22 - 32 mmol/L   Glucose, Bld 159 (H) 65 - 99 mg/dL   BUN 9 6 - 20 mg/dL   Creatinine, Ser 0.67 0.44 - 1.00 mg/dL   Calcium 9.1 8.9 - 10.3 mg/dL   Total Protein 8.2 (H) 6.5 - 8.1 g/dL   Albumin 4.1 3.5 - 5.0 g/dL   AST 52 (H) 15 - 41 U/L   ALT 37 14 - 54 U/L   Alkaline Phosphatase 79 38 - 126 U/L   Total Bilirubin 0.5 0.3 - 1.2 mg/dL   GFR calc non Af Amer >60 >60 mL/min   GFR calc Af Amer >60 >60 mL/min   Anion gap 10 5 - 15  Type and screen   Collection Time: 12/03/16  1:13 PM  Result Value Ref Range   ABO/RH(D) O NEG    Antibody Screen NEG    Sample Expiration 12/17/2016          Results for orders placed or performed during the hospital encounter of 11/07/16 (from the past 336 hour(s))  GC/Chlamydia probe amp ([place])not at [facility]   Collection Time: 11/07/16 12:00 AM  Result  Value Ref Range   Chlamydia Negative    Neisseria gonorrhea Negative   Urinalysis, Routine w reflex microscopic   Collection Time: 11/07/16  2:13 AM  Result Value Ref Range   Color, Urine YELLOW YELLOW   APPearance CLEAR CLEAR   Specific Gravity, Urine >1.030 (H) 1.005 - 1.030   pH 5.5 5.0 - 8.0   Glucose, UA NEGATIVE NEGATIVE mg/dL   Hgb urine dipstick NEGATIVE NEGATIVE   Bilirubin Urine NEGATIVE NEGATIVE   Ketones, ur NEGATIVE NEGATIVE mg/dL   Protein, ur NEGATIVE NEGATIVE mg/dL   Nitrite NEGATIVE NEGATIVE   Leukocytes, UA NEGATIVE NEGATIVE  CBC with Differential/Platelet   Collection Time: 11/07/16  2:15 AM  Result Value Ref Range   WBC 6.5 4.0 - 10.5 K/uL   RBC 3.73 (L) 3.87 - 5.11 MIL/uL   Hemoglobin 9.6 (L) 12.0 - 15.0 g/dL   HCT 30.7 (L) 36.0 - 46.0 %   MCV 82.3 78.0 - 100.0 fL   MCH 25.7 (L) 26.0 - 34.0 pg   MCHC 31.3 30.0 - 36.0 g/dL   RDW 18.5 (H) 11.5 - 15.5 %   Platelets 349 150 - 400 K/uL   Neutrophils Relative % 59 %   Neutro Abs 3.8 1.7 - 7.7 K/uL   Lymphocytes Relative 29 %   Lymphs Abs 1.9 0.7 - 4.0 K/uL   Monocytes Relative 7 %   Monocytes Absolute 0.4 0.1 - 1.0 K/uL   Eosinophils Relative 4 %   Eosinophils Absolute 0.3 0.0 - 0.7 K/uL   Basophils Relative 1 %   Basophils Absolute 0.0 0.0 - 0.1 K/uL  Basic metabolic panel   Collection Time: 11/07/16  2:15 AM  Result Value Ref Range   Sodium 136 135 - 145 mmol/L   Potassium 3.6 3.5 - 5.1 mmol/L   Chloride 101  101 - 111 mmol/L   CO2 25 22 - 32 mmol/L   Glucose, Bld 118 (H) 65 - 99 mg/dL   BUN 6 6 - 20 mg/dL   Creatinine, Ser 0.61 0.44 - 1.00 mg/dL   Calcium 9.0 8.9 - 10.3 mg/dL   GFR calc non Af Amer >60 >60 mL/min   GFR calc Af Amer >60 >60 mL/min   Anion gap 10 5 - 15  POC urine preg, ED (not at [facility])   Collection Time: 11/07/16  4:31 AM  Result Value Ref Range   Preg Test, Ur NEGATIVE NEGATIVE    EKG:    Orders placed or performed during  the hospital encounter of 08/16/15  . ED EKG within 10 minutes  . ED EKG within 10 minutes  . EKG 12-Lead  . EKG 12-Lead  . EKG    Imaging Studies:  ImagingResults  US Transvaginal Non-ob  Result Date: 11/07/2016 CLINICAL DATA:  46 y/o F; two days of left lower quadrant abdominal pain. EXAM: TRANSABDOMINAL AND TRANSVAGINAL ULTRASOUND OF PELVIS DOPPLER ULTRASOUND OF OVARIES TECHNIQUE: Both transabdominal and transvaginal ultrasound examinations of the pelvis were performed. Transabdominal technique was performed for global imaging of the pelvis including uterus, ovaries, adnexal regions, and pelvic cul-de-sac. It was necessary to proceed with endovaginal exam following the transabdominal exam to visualize the uterus and adnexa. Color and duplex Doppler ultrasound was utilized to evaluate blood flow to the ovaries. COMPARISON:  11/07/2016 CT of the abdomen and pelvis. FINDINGS: Uterus Measurements: 9.3 x 5.3 x 6.3 cm. Right anterior fundal uterine fibroid measuring up to 1.4 cm. Pelvic mass with mildly heterogeneous intermediate echogenicity measuring 11.2 x 8.9 x 8.0 cm superior to the uterine fundus. There appears to be a thin stalk with the uterine fundus (series 1, image 2 of 95 and cine series image 34 of 74). Endometrium Thickness: 10 mm.  No focal abnormality visualized. Right ovary Discrete right ovary not visualized. Left ovary Discrete left ovary not visualized. Doppler evaluation of the pelvic mass demonstrates no appreciable vascularity. Other findings No abnormal free fluid. 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 Discrete ovaries are not identified. Electronically Signed   By: Kristine Garbe M.D.   On: 11/07/2016 06:37   US Pelvis Complete  Result Date: 11/07/2016 CLINICAL DATA:  46 y/o F; two days of left lower quadrant abdominal pain. EXAM: TRANSABDOMINAL AND TRANSVAGINAL ULTRASOUND OF  PELVIS DOPPLER ULTRASOUND OF OVARIES TECHNIQUE: Both transabdominal and transvaginal ultrasound examinations of the pelvis were performed. Transabdominal technique was performed for global imaging of the pelvis including uterus, ovaries, adnexal regions, and pelvic cul-de-sac. It was necessary to proceed with endovaginal exam following the transabdominal exam to visualize the uterus and adnexa. Color and duplex Doppler ultrasound was utilized to evaluate blood flow to the ovaries. COMPARISON:  11/07/2016 CT of the abdomen and pelvis. FINDINGS: Uterus Measurements: 9.3 x 5.3 x 6.3 cm. Right anterior fundal uterine fibroid measuring up to 1.4 cm. Pelvic mass with mildly heterogeneous intermediate echogenicity measuring 11.2 x 8.9 x 8.0 cm superior to the uterine fundus. There appears to be a thin stalk with the uterine fundus (series 1, image 2 of 95 and cine series image 34 of 74). Endometrium Thickness: 10 mm.  No focal abnormality visualized. Right ovary Discrete right ovary not visualized. Left ovary Discrete left ovary not visualized. Doppler evaluation of the pelvic mass demonstrates no appreciable vascularity. Other findings No  abnormal free fluid. 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 Discrete ovaries are not identified. Electronically Signed   By: Kristine Garbe M.D.   On: 11/07/2016 06:37   [place] Art/ven Flow Abd Pelv Doppler  Result Date: 11/07/2016 CLINICAL DATA:  46 y/o F; two days of left lower quadrant abdominal pain. EXAM: TRANSABDOMINAL AND TRANSVAGINAL ULTRASOUND OF PELVIS DOPPLER ULTRASOUND OF OVARIES TECHNIQUE: Both transabdominal and transvaginal ultrasound examinations of the pelvis were performed. Transabdominal technique was performed for global imaging of the pelvis including uterus, ovaries, adnexal regions, and pelvic cul-de-sac. It was necessary to proceed with endovaginal exam following the  transabdominal exam to visualize the uterus and adnexa. Color and duplex Doppler ultrasound was utilized to evaluate blood flow to the ovaries. COMPARISON:  11/07/2016 CT of the abdomen and pelvis. FINDINGS: Uterus Measurements: 9.3 x 5.3 x 6.3 cm. Right anterior fundal uterine fibroid measuring up to 1.4 cm. Pelvic mass with mildly heterogeneous intermediate echogenicity measuring 11.2 x 8.9 x 8.0 cm superior to the uterine fundus. There appears to be a thin stalk with the uterine fundus (series 1, image 2 of 95 and cine series image 34 of 74). Endometrium Thickness: 10 mm.  No focal abnormality visualized. Right ovary Discrete right ovary not visualized. Left ovary Discrete left ovary not visualized. Doppler evaluation of the pelvic mass demonstrates no appreciable vascularity. Other findings No abnormal free fluid. 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 Discrete ovaries are not identified. Electronically Signed   By: Kristine Garbe M.D.   On: 11/07/2016 06:37   Ct Renal Stone Study  Result Date: 11/07/2016 CLINICAL DATA:  46 y/o F; left lower abdominal pain with nausea and vomiting. EXAM: CT ABDOMEN AND PELVIS WITHOUT CONTRAST TECHNIQUE: Multidetector CT imaging of the abdomen and pelvis was performed following the standard protocol without IV contrast. COMPARISON:  None. FINDINGS: Lower chest: No acute abnormality. Hepatobiliary: No focal liver abnormality is seen. Status post cholecystectomy. No biliary dilatation. Pancreas: Unremarkable. No pancreatic ductal dilatation or surrounding inflammatory changes. Spleen: Normal in size without focal abnormality. Adrenals/Urinary Tract: Adrenal glands are unremarkable. Kidneys are normal, without renal calculi, focal lesion, or hydronephrosis. Bladder is unremarkable. Stomach/Bowel: Stomach is within normal limits. Appendix appears normal. No evidence of bowel wall  thickening, distention, or inflammatory changes. Vascular/Lymphatic: No significant vascular findings are present. No enlarged abdominal or pelvic lymph nodes. Reproductive: Pelvic mass measuring 9.1 x 10.0 x 11.1 cm (AP x ML x CC series 2, image 72 and series 6, image 71). 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 Other: No abdominal wall hernia or abnormality. No abdominopelvic ascites. 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 Minimal degenerative grade 1 L3-4 anterolisthesis. Multilevel lumbar canal stenosis likely severe at the L3-4 level. IMPRESSION: 1. 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 Pelvic ultrasound recommended for further characterization. 2. No urinary stone disease or hydronephrosis. 3. Status post cholecystectomy. 4. 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 Electronically Signed   By: Kristine Garbe M.D.   On: 11/07/2016 03:53       Assessment: Large fibroid uterus undergoing degeneration Menometrorrhagia  Dysmenorrhea Anemia Pelvic pain  Patient Active Problem List   Diagnosis Date Noted  . Abdominal pain, LLQ 11/07/2016  . Chest pain   . Heart murmur     Plan: TAH BSO 12/05/2016  Will have blood available if needed  Pt understands the risks of surgery including but not limited t  excessive bleeding requiring transfusion or reoperation, post-operative infection requiring prolonged hospitalization or re-hospitalization and antibiotic therapy, and damage to other organs including bladder, bowel, ureters and major vessels.  The patient also understands the alternative treatment options which were discussed in full.  All questions were answered.  Mallory Neal  H 11/15/2016 11:28 AM   Florian Buff, MD 12/05/2016 8:07 AM

## 2016-12-06 LAB — BASIC METABOLIC PANEL
Anion gap: 8 (ref 5–15)
BUN: 5 mg/dL — AB (ref 6–20)
CHLORIDE: 102 mmol/L (ref 101–111)
CO2: 26 mmol/L (ref 22–32)
CREATININE: 0.49 mg/dL (ref 0.44–1.00)
Calcium: 8.4 mg/dL — ABNORMAL LOW (ref 8.9–10.3)
GFR calc non Af Amer: >60 mL/min (ref >60)
Glucose, Bld: 100 mg/dL — ABNORMAL HIGH (ref 65–99)
Potassium: 4.3 mmol/L (ref 3.5–5.1)
SODIUM: 136 mmol/L (ref 135–145)

## 2016-12-06 LAB — CBC
HCT: 31.5 % — ABNORMAL LOW (ref 36.0–46.0)
HEMOGLOBIN: 9.6 g/dL — AB (ref 12.0–15.0)
MCH: 26.7 pg (ref 26.0–34.0)
MCHC: 30.5 g/dL (ref 30.0–36.0)
MCV: 87.7 fL (ref 78.0–100.0)
PLATELETS: 401 10*3/uL — AB (ref 150–400)
RBC: 3.59 MIL/uL — AB (ref 3.87–5.11)
RDW: 19.5 % — ABNORMAL HIGH (ref 11.5–15.5)
WBC: 10.6 10*3/uL — ABNORMAL HIGH (ref 4.0–10.5)

## 2016-12-06 LAB — HIV ANTIBODY (ROUTINE TESTING W REFLEX): HIV Screen 4th Generation wRfx: NONREACTIVE

## 2016-12-06 MED ORDER — OXYCODONE-ACETAMINOPHEN 7.5-325 MG PO TABS: 1.0000 | ORAL_TABLET | Freq: Four times a day (QID) | ORAL | 0 refills | Status: DC | PRN

## 2016-12-06 MED ORDER — KETOROLAC TROMETHAMINE 10 MG PO TABS: 10.0000 mg | ORAL_TABLET | Freq: Three times a day (TID) | ORAL | 0 refills | Status: DC | PRN

## 2016-12-06 MED ORDER — ONDANSETRON 8 MG PO TBDP: 8.0000 mg | ORAL_TABLET | Freq: Three times a day (TID) | ORAL | 0 refills | Status: DC | PRN

## 2016-12-06 NOTE — Addendum Note (Signed)
Addendum  created 12/06/16 1438 by Charmaine Downs, CRNA   Sign clinical note

## 2016-12-06 NOTE — Anesthesia Postprocedure Evaluation (Signed)
Anesthesia Post Note  Patient: Mallory Neal  Procedure(s) Performed: Procedure(s) (LRB): HYSTERECTOMY ABDOMINAL (N/A) SALPINGO OOPHORECTOMY (Bilateral)  Patient location during evaluation: Nursing Unit Anesthesia Type: General Level of consciousness: awake, oriented and patient cooperative Pain management: pain level controlled Vital Signs Assessment: post-procedure vital signs reviewed and stable Respiratory status: spontaneous breathing, nonlabored ventilation and respiratory function stable Cardiovascular status: blood pressure returned to baseline Postop Assessment: no signs of nausea or vomiting Anesthetic complications: no     Last Vitals:  Vitals:   12/05/16 2131 12/06/16 0455  BP: (!) 110/50 131/77  Pulse: 72 78  Resp: 16 16  Temp: 36.8 C 37.2 C    Last Pain:  Vitals:   12/06/16 1316  TempSrc:   PainSc: 3                  Crislyn Willbanks J

## 2016-12-06 NOTE — Discharge Summary (Signed)
Physician Discharge Summary  Patient ID: Mallory Neal MRN: [id number] DOB/AGE: 03/29/1971 46 y.o.  Admit date: 12/05/2016 Discharge date: 12/06/2016  Admission Diagnoses: S/p TAH BSO Discharge Diagnoses:  Active Problems:   S/P TAH-BSO (total abdominal hysterectomy and bilateral salpingo-oophorectomy)   Discharged Condition: good  Hospital Course: unremarkeable  Consults: None  Significant Diagnostic Studies: labs:   Treatments: surgery: TAH BSO  Discharge Exam: Blood pressure 131/77, pulse 78, temperature 98.9 F (37.2 C), temperature source Oral, resp. rate 16, height 5\' 6"  (1.676 m), weight 207 lb (93.9 kg), SpO2 100 %. General appearance: alert, cooperative and no distress GI: soft, non-tender; bowel sounds normal; no masses,  no organomegaly Incision/Wound:clean dry intact  Disposition: 01-Home or Self Care  Discharge Instructions    Call MD for:  persistant nausea and vomiting    Complete by:  As directed    Call MD for:  severe uncontrolled pain    Complete by:  As directed    Call MD for:  temperature >100.4    Complete by:  As directed    Diet - low sodium heart healthy    Complete by:  As directed    Driving Restrictions    Complete by:  As directed    No driving for 1 week   Increase activity slowly    Complete by:  As directed    Leave dressing on - Keep it clean, dry, and intact until clinic visit    Complete by:  As directed    Lifting restrictions    Complete by:  As directed    Do not lift more than 10 pounds until Dr Elonda Husky oks   Sexual Activity Restrictions    Complete by:  As directed    No sex for 6 weeks     Allergies as of 12/06/2016      Reactions   Codeine Hives, Shortness Of Breath, Rash      Medication List    STOP taking these medications   megestrol 40 MG tablet Commonly known as:  MEGACE   oxyCODONE-acetaminophen 5-325 MG tablet Commonly known as:  PERCOCET Replaced by:  oxyCODONE-acetaminophen 7.5-325 MG tablet    promethazine 25 MG tablet Commonly known as:  PHENERGAN   traMADol 50 MG tablet Commonly known as:  ULTRAM     TAKE these medications   ibuprofen 800 MG tablet Commonly known as:  ADVIL,MOTRIN Take 1 tablet (800 mg total) by mouth 3 (three) times daily.   ketorolac 10 MG tablet Commonly known as:  TORADOL Take 1 tablet (10 mg total) by mouth every 8 (eight) hours as needed.   ondansetron 8 MG disintegrating tablet Commonly known as:  ZOFRAN ODT Take 1 tablet (8 mg total) by mouth every 8 (eight) hours as needed for nausea or vomiting.   oxyCODONE-acetaminophen 7.5-325 MG tablet Commonly known as:  PERCOCET Take 1-2 tablets by mouth every 6 (six) hours as needed. Replaces:  oxyCODONE-acetaminophen 5-325 MG tablet      Follow-up Information    EURE,LUTHER H, MD Follow up in 1 week(s).   Specialties:  Obstetrics and Gynecology, Radiology Why:  post op Contact information: [place] [address] [phone]           Signed: Florian Buff 12/06/2016, 1:08 PM

## 2016-12-06 NOTE — Discharge Instructions (Signed)
Abdominal Hysterectomy, Care After °This sheet gives you information about how to care for yourself after your procedure. Your health care provider may also give you more specific instructions. If you have problems or questions, contact your health care provider. °What can I expect after the procedure? °After your procedure, it is common to have: °· Pain. °· Fatigue. °· Poor appetite. °· Less interest in sex. °· Vaginal bleeding and discharge. You may need to use a sanitary napkin after this procedure. ° °Follow these instructions at home: °Bathing °· Do not take baths, swim, or use a hot tub until your health care provider approves. Ask your health care provider if you can take showers. You may only be allowed to take sponge baths for bathing. °· Keep the bandage (dressing) dry until your health care provider says it can be removed. °Incision care °· Follow instructions from your health care provider about how to take care of your incision. Make sure you: °? Wash your hands with soap and water before you change your bandage (dressing). If soap and water are not available, use hand sanitizer. °? Change your dressing as told by your health care provider. °? Leave stitches (sutures), skin glue, or adhesive strips in place. These skin closures may need to stay in place for 2 weeks or longer. If adhesive strip edges start to loosen and curl up, you may trim the loose edges. Do not remove adhesive strips completely unless your health care provider tells you to do that. °· Check your incision area every day for signs of infection. Check for: °? Redness, swelling, or pain. °? Fluid or blood. °? Warmth. °? Pus or a bad smell. °Activity °· Do gentle, daily exercises as told by your health care provider. You may be told to take short walks every day and go farther each time. °· Do not lift anything that is heavier than 10 lb (4.5 kg), or the limit that your health care provider tells you, until he or she says that it is  safe. °· Do not drive or use heavy machinery while taking prescription pain medicine. °· Do not drive for 24 hours if you were given a medicine to help you relax (sedative). °· Follow your health care provider's instructions about exercise, driving, and general activities. Ask your health care provider what activities are safe for you. °Lifestyle °· Do not douche, use tampons, or have sex for at least 6 weeks or as told by your health care provider. °· Do not drink alcohol until your health care provider approves. °· Drink enough fluid to keep your urine clear or pale yellow. °· Try to have someone at home with you for the first 1-2 weeks to help. °· Do not use any products that contain nicotine or tobacco, such as cigarettes and e-cigarettes. These can delay healing. If you need help quitting, ask your health care provider. °General instructions °· Take over-the-counter and prescription medicines only as told by your health care provider. °· Do not take aspirin or ibuprofen. These medicines can cause bleeding. °· To prevent or treat constipation while you are taking prescription pain medicine, your health care provider may recommend that you: °? Drink enough fluid to keep your urine clear or pale yellow. °? Take over-the-counter or prescription medicines. °? Eat foods that are high in fiber, such as fresh fruits and vegetables, whole grains, and beans. °? Limit foods that are high in fat and processed sugars, such as fried and sweet foods. °· Keep all   follow-up visits as told by your health care provider. This is important. °Contact a health care provider if: °· You have chills or fever. °· You have redness, swelling, or pain around your incision. °· You have fluid or blood coming from your incision. °· Your incision feels warm to the touch. °· You have pus or a bad smell coming from your incision. °· Your incision breaks open. °· You feel dizzy or light-headed. °· You have pain or bleeding when you urinate. °· You  have persistent diarrhea. °· You have persistent nausea and vomiting. °· You have abnormal vaginal discharge. °· You have a rash. °· You have any type of abnormal reaction or you develop an allergy to your medicine. °· Your pain medicine does not help. °Get help right away if: °· You have a fever and your symptoms suddenly get worse. °· You have severe abdominal pain. °· You have shortness of breath. °· You faint. °· You have pain, swelling, or redness in your leg. °· You have heavy vaginal bleeding with blood clots. °Summary °· After your procedure, it is common to have pain, fatigue and vaginal discharge. °· Do not take baths, swim, or use a hot tub until your health care provider approves. Ask your health care provider if you can take showers. You may only be allowed to take sponge baths for bathing. °· Follow your health care provider's instructions about exercise, driving, and general activities. Ask your health care provider what activities are safe for you. °· Do not lift anything that is heavier than 10 lb (4.5 kg), or the limit that your health care provider tells you, until he or she says that it is safe. °· Try to have someone at home with you for the first 1-2 weeks to help. °This information is not intended to replace advice given to you by your health care provider. Make sure you discuss any questions you have with your health care provider. °Document Released: 02/16/2005 Document Revised: 07/18/2016 Document Reviewed: 07/18/2016 °Elsevier Interactive Patient Education © 2017 [employer] ° °

## 2016-12-06 NOTE — Discharge Planning (Signed)
Patient IV removed.  Discharge papers given, explained and educated.  Informed of suggested FU appt and appt made.  Script for pain meds given and told of other scripts sent to [place], [place].  RN assessment and VS revealed stability for discharge to home.  Patient wheeled to front and family transporting home via car.

## 2016-12-06 NOTE — Care Management Note (Signed)
Case Management Note  Patient Details  Name: Mallory Neal MRN: [id number] Date of Birth: June 18, 1971  Subjective/Objective:                  Pt admitted s/p TAH-BSO. Chart reviewed for CM needs. Pt is from home, lives with family. She has PCP, transportation to appointments and no insurance with drug coverage.   Action/Plan: Anticipate DC home with self care.   Expected Discharge Date:       12/06/2016           Expected Discharge Plan:  Home/Self Care  In-House Referral:  NA  Discharge planning Services  CM Consult  Post Acute Care Choice:  NA Choice offered to:  NA  Status of Service:  Completed, signed off  Sherald Barge, RN 12/06/2016, 11:29 AM

## 2016-12-07 ENCOUNTER — Encounter (HOSPITAL_COMMUNITY): Payer: Self-pay | Admitting: Obstetrics & Gynecology

## 2016-12-14 ENCOUNTER — Encounter: Payer: Self-pay | Admitting: Obstetrics & Gynecology

## 2016-12-14 ENCOUNTER — Ambulatory Visit (INDEPENDENT_AMBULATORY_CARE_PROVIDER_SITE_OTHER): Payer: BLUE CROSS/BLUE SHIELD | Admitting: Obstetrics & Gynecology

## 2016-12-14 VITALS — BP 118/84 | HR 88 | Ht 66.0 in | Wt 197.0 lb

## 2016-12-14 DIAGNOSIS — Z9889 Other specified postprocedural states: Secondary | ICD-10-CM

## 2016-12-14 MED ORDER — ESTRADIOL 2 MG PO TABS: 2.0000 mg | ORAL_TABLET | Freq: Every day | ORAL | 11 refills | Status: DC

## 2016-12-14 MED ORDER — OXYCODONE-ACETAMINOPHEN 5-325 MG PO TABS: 1.0000 | ORAL_TABLET | ORAL | 0 refills | Status: DC | PRN

## 2016-12-14 NOTE — Progress Notes (Signed)
  HPI: Patient returns for routine postoperative follow-up having undergone TAH BSO on 12/05/2016.  The patient's immediate postoperative recovery has been unremarkable. Since hospital discharge the patient reports no problems.   Current Outpatient Prescriptions: ibuprofen (ADVIL,MOTRIN) 800 MG tablet, Take 1 tablet (800 mg total) by mouth 3 (three) times daily., Disp: 21 tablet, Rfl: 0 ketorolac (TORADOL) 10 MG tablet, Take 1 tablet (10 mg total) by mouth every 8 (eight) hours as needed., Disp: 15 tablet, Rfl: 0 ondansetron (ZOFRAN ODT) 8 MG disintegrating tablet, Take 1 tablet (8 mg total) by mouth every 8 (eight) hours as needed for nausea or vomiting., Disp: 20 tablet, Rfl: 0 oxyCODONE-acetaminophen (PERCOCET) 7.5-325 MG tablet, Take 1-2 tablets by mouth every 6 (six) hours as needed., Disp: 40 tablet, Rfl: 0 estradiol (ESTRACE) 2 MG tablet, Take 1 tablet (2 mg total) by mouth daily., Disp: 30 tablet, Rfl: 11 oxyCODONE-acetaminophen (PERCOCET/ROXICET) 5-325 MG tablet, Take 1 tablet by mouth every 4 (four) hours as needed for severe pain., Disp: 20 tablet, Rfl: 0  No current facility-administered medications for this visit.     Blood pressure 118/84, pulse 88, height 5\' 6"  (1.676 m), weight 197 lb (89.4 kg).  Physical Exam: Incision clean dry intact abdomen soft normal  Diagnostic Tests:   Pathology: Benign cystic teratoma  Impression: s/p TAH BSO  Plan: Routine post op care  Follow up: 4  weeks  Florian Buff, MD

## 2017-01-11 ENCOUNTER — Ambulatory Visit (INDEPENDENT_AMBULATORY_CARE_PROVIDER_SITE_OTHER): Payer: BLUE CROSS/BLUE SHIELD | Admitting: Obstetrics & Gynecology

## 2017-01-11 ENCOUNTER — Encounter: Payer: Self-pay | Admitting: Obstetrics & Gynecology

## 2017-01-11 VITALS — BP 140/80 | HR 72 | Ht 66.0 in | Wt 196.0 lb

## 2017-01-11 DIAGNOSIS — Z90710 Acquired absence of both cervix and uterus: Secondary | ICD-10-CM

## 2017-01-11 NOTE — Progress Notes (Signed)
  HPI: Patient returns for routine postoperative follow-up having undergone TAH BSO on 12/05/2016.  The patient's immediate postoperative recovery has been unremarkable. Since hospital discharge the patient reports no problems doing well.   Current Outpatient Prescriptions: estradiol (ESTRACE) 2 MG tablet, Take 1 tablet (2 mg total) by mouth daily., Disp: 30 tablet, Rfl: 11  No current facility-administered medications for this visit.     Blood pressure 140/80, pulse 72, height 5\' 6"  (1.676 m), weight 196 lb (88.9 kg).  Physical Exam: Incision clean dry intact Cuff sluggish to heal, nothing per vagina for 4 weeks  Diagnostic Tests:   Pathology: benign  Impression: S/P TAH BSO  Plan: Routine post op care No sex 4 weeks RTW 1 1/2 weeks  Follow up: 1  years Continue estrdiol 2 mg daily Florian Buff, MD

## 2019-02-24 ENCOUNTER — Emergency Department (HOSPITAL_COMMUNITY)
Admission: EM | Admit: 2019-02-24 | Discharge: 2019-02-24 | Disposition: A | Discharge status: Home / Self Care | Payer: Self-pay | Attending: Emergency Medicine | Admitting: Emergency Medicine

## 2019-02-24 ENCOUNTER — Other Ambulatory Visit: Payer: Self-pay

## 2019-02-24 ENCOUNTER — Emergency Department (HOSPITAL_COMMUNITY): Payer: Self-pay

## 2019-02-24 ENCOUNTER — Encounter (HOSPITAL_COMMUNITY): Payer: Self-pay

## 2019-02-24 DIAGNOSIS — R0789 Other chest pain: Secondary | ICD-10-CM | POA: Insufficient documentation

## 2019-02-24 LAB — BASIC METABOLIC PANEL
Anion gap: 9 (ref 5–15)
BUN: 9 mg/dL (ref 6–20)
CO2: 28 mmol/L (ref 22–32)
Calcium: 8.9 mg/dL (ref 8.9–10.3)
Chloride: 100 mmol/L (ref 98–111)
Creatinine, Ser: 0.58 mg/dL (ref 0.44–1.00)
GFR calc Af Amer: >60 mL/min (ref >60)
GFR calc non Af Amer: >60 mL/min (ref >60)
Glucose, Bld: 92 mg/dL (ref 70–99)
Potassium: 3.8 mmol/L (ref 3.5–5.1)
Sodium: 137 mmol/L (ref 135–145)

## 2019-02-24 LAB — D-DIMER, QUANTITATIVE: D-Dimer, Quant: <0.27 ug/mL-FEU (ref 0.00–0.50)

## 2019-02-24 LAB — CBC WITH DIFFERENTIAL/PLATELET
Abs Immature Granulocytes: 0.02 10*3/uL (ref 0.00–0.07)
Basophils Absolute: 0 10*3/uL (ref 0.0–0.1)
Basophils Relative: 0 %
Eosinophils Absolute: 0.6 10*3/uL — ABNORMAL HIGH (ref 0.0–0.5)
Eosinophils Relative: 7 %
HCT: 38.4 % (ref 36.0–46.0)
Hemoglobin: 12.5 g/dL (ref 12.0–15.0)
Immature Granulocytes: 0 %
Lymphocytes Relative: 16 %
Lymphs Abs: 1.5 10*3/uL (ref 0.7–4.0)
MCH: 33.9 pg (ref 26.0–34.0)
MCHC: 32.6 g/dL (ref 30.0–36.0)
MCV: 104.1 fL — ABNORMAL HIGH (ref 80.0–100.0)
Monocytes Absolute: 0.8 10*3/uL (ref 0.1–1.0)
Monocytes Relative: 9 %
Neutro Abs: 6.2 10*3/uL (ref 1.7–7.7)
Neutrophils Relative %: 68 %
Platelets: 252 10*3/uL (ref 150–400)
RBC: 3.69 MIL/uL — ABNORMAL LOW (ref 3.87–5.11)
RDW: 13.3 % (ref 11.5–15.5)
WBC: 9.1 10*3/uL (ref 4.0–10.5)
nRBC: 0 % (ref 0.0–0.2)

## 2019-02-24 LAB — TROPONIN I (HIGH SENSITIVITY)
Troponin I (High Sensitivity): <2 ng/L (ref <18)
Troponin I (High Sensitivity): <2 ng/L (ref <18)

## 2019-02-24 MED ORDER — IBUPROFEN 400 MG PO TABS
400.0000 mg | ORAL_TABLET | Freq: Once | ORAL | Status: AC
  Administered 2019-02-24: 16:00:00 400 mg via ORAL
  Filled 2019-02-24: qty 1

## 2019-02-24 MED ORDER — METHOCARBAMOL 500 MG PO TABS: 1000.0000 mg | ORAL_TABLET | Freq: Four times a day (QID) | ORAL | 0 refills | Status: DC | PRN

## 2019-02-24 MED ORDER — NAPROXEN 250 MG PO TABS: 250.0000 mg | ORAL_TABLET | Freq: Two times a day (BID) | ORAL | 0 refills | Status: DC | PRN

## 2019-02-24 MED ORDER — ACETAMINOPHEN 325 MG PO TABS
650.0000 mg | ORAL_TABLET | Freq: Once | ORAL | Status: AC
  Administered 2019-02-24: 650 mg via ORAL
  Filled 2019-02-24: qty 2

## 2019-02-24 NOTE — ED Triage Notes (Signed)
Pt reports chest pain, cough, and chills x 3 days.

## 2019-02-24 NOTE — ED Provider Notes (Signed)
[facility] Provider Note   CSN: [id number] Arrival date & time: 02/24/19  1339     History   Chief Complaint Chief Complaint  Patient presents with  . Chest Pain    HPI Mallory Neal is a 48 y.o. female.     HPI  Pt was seen at 1530. Per pt, c/o gradual onset and persistence of constant right sided upper chest "pains" for the past 3 days. Pt states the pain radiates into the left side of her chest. Has been associated with "a little cough" that started today. Denies sore throat, no fevers, no rash, no injury, no palpitations, no SOB, no travel, no known sick/COVID contacts, no abd pain, no N/V/D.    Past Medical History:  Diagnosis Date  . Anemia   . Arthritis   . Chest pain   . Heart murmur     Patient Active Problem List   Diagnosis Date Noted  . S/P TAH-BSO (total abdominal hysterectomy and bilateral salpingo-oophorectomy) 12/05/2016  . Abdominal pain, LLQ 11/07/2016  . Chest pain   . Heart murmur     Past Surgical History:  Procedure Laterality Date  . ABDOMINAL HYSTERECTOMY N/A 12/05/2016   Procedure: HYSTERECTOMY ABDOMINAL;  Surgeon: Florian Buff, MD;  Location: [facility];  Service: Gynecology;  Laterality: N/A;  . CHOLECYSTECTOMY    . SALPINGOOPHORECTOMY Bilateral 12/05/2016   Procedure: SALPINGO OOPHORECTOMY;  Surgeon: Florian Buff, MD;  Location: [facility];  Service: Gynecology;  Laterality: Bilateral;     OB History    Gravida  2   Para  2   Term      Preterm      AB      Living        SAB      TAB      Ectopic      Multiple      Live Births               Home Medications    Prior to Admission medications   Medication Sig Start Date End Date Taking? Authorizing Provider  estradiol (ESTRACE) 2 MG tablet Take 1 tablet (2 mg total) by mouth daily. 12/14/16   Florian Buff, MD    Family History Family History  Problem Relation Age of Onset  . Diabetes Paternal Grandfather   . Heart disease Paternal  Grandmother   . Heart disease Maternal Grandmother   . Cancer Father        lung    Social History Social History   Tobacco Use  . Smoking status: Never Smoker  . Smokeless tobacco: Never Used  Substance Use Topics  . Alcohol use: Yes    Comment: occ  . Drug use: No     Allergies   Codeine   Review of Systems Review of Systems ROS: Statement: All systems negative except as marked or noted in the HPI; Constitutional: Negative for fever and chills. ; ; Eyes: Negative for eye pain, redness and discharge. ; ; ENMT: Negative for ear pain, hoarseness, nasal congestion, sinus pressure and sore throat. ; ; Cardiovascular: +CP. Negative for palpitations, diaphoresis, dyspnea and peripheral edema. ; ; Respiratory: Negative for cough, wheezing and stridor. ; ; Gastrointestinal: Negative for nausea, vomiting, diarrhea, abdominal pain, blood in stool, hematemesis, jaundice and rectal bleeding. . ; ; Genitourinary: Negative for dysuria, flank pain and hematuria. ; ; Musculoskeletal: Negative for back pain and neck pain. Negative for swelling and trauma.; ; Skin:  Negative for pruritus, rash, abrasions, blisters, bruising and skin lesion.; ; Neuro: Negative for headache, lightheadedness and neck stiffness. Negative for weakness, altered level of consciousness, altered mental status, extremity weakness, paresthesias, involuntary movement, seizure and syncope.       Physical Exam Updated Vital Signs BP 134/75   Pulse 62   Temp 98.2 F (36.8 C) (Oral)   Resp (!) 25   Ht 5\' 6"  (1.676 m)   Wt 86.2 kg   LMP  (LMP Unknown) Comment: states she took a pill to stop periods  SpO2 99%   BMI 30.67 kg/m   Physical Exam 1535: Physical examination:  Nursing notes reviewed; Vital signs and O2 SAT reviewed;  Constitutional: Well developed, Well nourished, Well hydrated, In no acute distress; Head:  Normocephalic, atraumatic; Eyes: EOMI, PERRL, No scleral icterus; ENMT: Mouth and pharynx normal, Mucous  membranes moist; Neck: Supple, Full range of motion, No lymphadenopathy; Cardiovascular: Regular rate and rhythm, No gallop; Respiratory: Breath sounds clear & equal bilaterally, No wheezes.  Speaking full sentences with ease, Normal respiratory effort/excursion; Chest: No deformity, mild right upper chest wall TTP.  Movement normal; Abdomen: Soft, Nontender, Nondistended, Normal bowel sounds; Genitourinary: No CVA tenderness; Extremities: Peripheral pulses normal, No tenderness, No edema, No calf edema or asymmetry.; Neuro: AA&Ox3, Major CN grossly intact.  Speech clear. No gross focal motor or sensory deficits in extremities.; Skin: Color normal, Warm, Dry.   ED Treatments / Results  Labs (all labs ordered are listed, but only abnormal results are displayed)   EKG EKG Interpretation  Date/Time:  Tuesday February 24 2019 14:08:07 EDT Ventricular Rate:  71 PR Interval:    QRS Duration: 88 QT Interval:  398 QTC Calculation: 433 R Axis:   69 Text Interpretation:  Sinus rhythm Low voltage, extremity and precordial leads No STEMI  Confirmed by Nanda Quinton [phone]) on 02/24/2019 2:38:24 PM   Radiology   Procedures Procedures (including critical care time)  Medications Ordered in ED Medications  acetaminophen (TYLENOL) tablet 650 mg (650 mg Oral Given 02/24/19 1615)  ibuprofen (ADVIL) tablet 400 mg (400 mg Oral Given 02/24/19 1615)     Initial Impression / Assessment and Plan / ED Course  I have reviewed the triage vital signs and the nursing notes.  Pertinent labs & imaging results that were available during my care of the patient were reviewed by me and considered in my medical decision making (see chart for details).     MDM Reviewed: previous chart, nursing note and vitals Reviewed previous: labs and ECG Interpretation: labs, ECG and x-ray    Results for orders placed or performed during the hospital encounter of 93/81/82  Basic metabolic panel  Result Value Ref Range    Sodium 137 135 - 145 mmol/L   Potassium 3.8 3.5 - 5.1 mmol/L   Chloride 100 98 - 111 mmol/L   CO2 28 22 - 32 mmol/L   Glucose, Bld 92 70 - 99 mg/dL   BUN 9 6 - 20 mg/dL   Creatinine, Ser 0.58 0.44 - 1.00 mg/dL   Calcium 8.9 8.9 - 10.3 mg/dL   GFR calc non Af Amer >60 >60 mL/min   GFR calc Af Amer >60 >60 mL/min   Anion gap 9 5 - 15  CBC with Differential  Result Value Ref Range   WBC 9.1 4.0 - 10.5 K/uL   RBC 3.69 (L) 3.87 - 5.11 MIL/uL   Hemoglobin 12.5 12.0 - 15.0 g/dL   HCT 38.4 36.0 - 46.0 %  MCV 104.1 (H) 80.0 - 100.0 fL   MCH 33.9 26.0 - 34.0 pg   MCHC 32.6 30.0 - 36.0 g/dL   RDW 13.3 11.5 - 15.5 %   Platelets 252 150 - 400 K/uL   nRBC 0.0 0.0 - 0.2 %   Neutrophils Relative % 68 %   Neutro Abs 6.2 1.7 - 7.7 K/uL   Lymphocytes Relative 16 %   Lymphs Abs 1.5 0.7 - 4.0 K/uL   Monocytes Relative 9 %   Monocytes Absolute 0.8 0.1 - 1.0 K/uL   Eosinophils Relative 7 %   Eosinophils Absolute 0.6 (H) 0.0 - 0.5 K/uL   Basophils Relative 0 %   Basophils Absolute 0.0 0.0 - 0.1 K/uL   Immature Granulocytes 0 %   Abs Immature Granulocytes 0.02 0.00 - 0.07 K/uL  D-dimer, quantitative  Result Value Ref Range   D-Dimer, Quant <0.27 0.00 - 0.50 ug/mL-FEU  Troponin I (High Sensitivity)  Result Value Ref Range   Troponin I (High Sensitivity) <2.0 <18 ng/L  Troponin I (High Sensitivity)  Result Value Ref Range   Troponin I (High Sensitivity) <2.0 <18 ng/L   Dg Chest Portable 1 View Result Date: 02/24/2019 CLINICAL DATA:  Chest pain, cough and chills for 3 days. EXAM: PORTABLE CHEST 1 VIEW COMPARISON:  Chest x-rays dated 08/16/2015 in 03/12/2010. FINDINGS: Heart size is upper normal. Lungs are clear. No pleural effusion or pneumothorax seen. Osseous structures about the chest are unremarkable. IMPRESSION: No active disease. No evidence of pneumonia or pulmonary edema. Electronically Signed   By: Franki Cabot M.D.   On: 02/24/2019 14:59    RILEE KNOLL was evaluated in Emergency  Department on 02/24/2019 for the symptoms described in the history of present illness. She was evaluated in the context of the global COVID-19 pandemic, which necessitated consideration that the patient might be at risk for infection with the SARS-CoV-2 virus that causes COVID-19. Institutional protocols and algorithms that pertain to the evaluation of patients at risk for COVID-19 are in a state of rapid change based on information released by regulatory bodies including the CDC and federal and state organizations. These policies and algorithms were followed during the patient's care in the ED.   1940:  Doubt PE as cause for symptoms with normal d-dimer and low risk [place].  Doubt ACS as cause for symptoms with normal troponin x2 and unchanged EKG from previous after 3 days of constant atypical symptoms. Tx symptomatically, f/u PMD. Dx and testing, d/w pt.  Questions answered.  Verb understanding, agreeable to d/c home with outpt f/u.     Final Clinical Impressions(s) / ED Diagnoses   Final diagnoses:  None    ED Discharge Orders    None       Francine Graven, DO 02/27/19 1556

## 2019-02-24 NOTE — Discharge Instructions (Signed)
Take the prescriptions as directed.  Apply moist heat or ice to the area(s) of discomfort, for 15 minutes at a time, several times per day for the next few days.  Do not fall asleep on a heating or ice pack.  Call your regular medical doctor tomorrow to schedule a follow up appointment this week.  Return to the Emergency Department immediately if worsening.

## 2019-03-10 ENCOUNTER — Other Ambulatory Visit: Payer: Self-pay

## 2019-03-10 ENCOUNTER — Emergency Department (HOSPITAL_COMMUNITY): Payer: Self-pay

## 2019-03-10 ENCOUNTER — Emergency Department (HOSPITAL_COMMUNITY)
Admission: EM | Admit: 2019-03-10 | Discharge: 2019-03-11 | Disposition: A | Discharge status: Home / Self Care | Payer: Self-pay | Attending: Emergency Medicine | Admitting: Emergency Medicine

## 2019-03-10 ENCOUNTER — Encounter (HOSPITAL_COMMUNITY): Payer: Self-pay | Admitting: Emergency Medicine

## 2019-03-10 DIAGNOSIS — F10920 Alcohol use, unspecified with intoxication, uncomplicated: Secondary | ICD-10-CM

## 2019-03-10 DIAGNOSIS — F17210 Nicotine dependence, cigarettes, uncomplicated: Secondary | ICD-10-CM | POA: Insufficient documentation

## 2019-03-10 DIAGNOSIS — Y999 Unspecified external cause status: Secondary | ICD-10-CM | POA: Insufficient documentation

## 2019-03-10 DIAGNOSIS — Y929 Unspecified place or not applicable: Secondary | ICD-10-CM | POA: Insufficient documentation

## 2019-03-10 DIAGNOSIS — S82842A Displaced bimalleolar fracture of left lower leg, initial encounter for closed fracture: Secondary | ICD-10-CM

## 2019-03-10 DIAGNOSIS — W07XXXA Fall from chair, initial encounter: Secondary | ICD-10-CM | POA: Insufficient documentation

## 2019-03-10 DIAGNOSIS — Q7292 Unspecified reduction defect of left lower limb: Secondary | ICD-10-CM

## 2019-03-10 DIAGNOSIS — R4182 Altered mental status, unspecified: Secondary | ICD-10-CM | POA: Insufficient documentation

## 2019-03-10 DIAGNOSIS — Y939 Activity, unspecified: Secondary | ICD-10-CM | POA: Insufficient documentation

## 2019-03-10 DIAGNOSIS — F10220 Alcohol dependence with intoxication, uncomplicated: Secondary | ICD-10-CM | POA: Insufficient documentation

## 2019-03-10 MED ORDER — HYDROMORPHONE HCL 1 MG/ML IJ SOLN
1.0000 mg | Freq: Once | INTRAMUSCULAR | Status: DC
  Filled 2019-03-10: qty 1

## 2019-03-10 MED ORDER — CYCLOBENZAPRINE HCL 10 MG PO TABS
10.0000 mg | ORAL_TABLET | Freq: Once | ORAL | Status: DC
  Filled 2019-03-10: qty 1

## 2019-03-10 MED ORDER — ONDANSETRON HCL 4 MG PO TABS
4.0000 mg | ORAL_TABLET | Freq: Once | ORAL | Status: DC
  Filled 2019-03-10: qty 1

## 2019-03-10 NOTE — ED Provider Notes (Signed)
[facility] EMERGENCY DEPARTMENT Provider Note   CSN: [id number] Arrival date & time: 03/10/19  2122     History   Chief Complaint Chief Complaint  Patient presents with  . Ankle Pain    HPI Mallory Neal is a 48 y.o. female.     Patient is a 48 year old female who presents to the emergency department by EMS following a fall.   The patient is intoxicated.  She says she has been drinking beer and moonshine most of the night.  She will wake up and be alert and then have less alertness back-and-forth.  She says that she fell out of a chair and injured her left lower extremity.  She denies hitting her head, or injuring her neck.  She denies injuring her chest or her pelvis.  Patient denies being on any anticoagulation medications.  She presents now for assistance with her left lower extremity injury.   The history is provided by the patient.  Ankle Pain Associated symptoms: no back pain and no neck pain     Past Medical History:  Diagnosis Date  . Anemia   . Arthritis   . Chest pain   . Heart murmur     Patient Active Problem List   Diagnosis Date Noted  . S/P TAH-BSO (total abdominal hysterectomy and bilateral salpingo-oophorectomy) 12/05/2016  . Abdominal pain, LLQ 11/07/2016  . Chest pain   . Heart murmur     Past Surgical History:  Procedure Laterality Date  . ABDOMINAL HYSTERECTOMY N/A 12/05/2016   Procedure: HYSTERECTOMY ABDOMINAL;  Surgeon: Florian Buff, MD;  Location: [facility];  Service: Gynecology;  Laterality: N/A;  . CHOLECYSTECTOMY    . SALPINGOOPHORECTOMY Bilateral 12/05/2016   Procedure: SALPINGO OOPHORECTOMY;  Surgeon: Florian Buff, MD;  Location: [facility];  Service: Gynecology;  Laterality: Bilateral;     OB History    Gravida  2   Para  2   Term      Preterm      AB      Living        SAB      TAB      Ectopic      Multiple      Live Births               Home Medications    Prior to Admission medications   Medication Sig  Start Date End Date Taking? Authorizing Provider  methocarbamol (ROBAXIN) 500 MG tablet Take 2 tablets (1,000 mg total) by mouth 4 (four) times daily as needed for muscle spasms (muscle spasm/pain). 02/24/19   Francine Graven, DO  naproxen (NAPROSYN) 250 MG tablet Take 1 tablet (250 mg total) by mouth 2 (two) times daily as needed for mild pain or moderate pain (take with food). 02/24/19   Francine Graven, DO    Family History Family History  Problem Relation Age of Onset  . Diabetes Paternal Grandfather   . Heart disease Paternal Grandmother   . Heart disease Maternal Grandmother   . Cancer Father        lung    Social History Social History   Tobacco Use  . Smoking status: Current Every Day Smoker    Types: Cigarettes  . Smokeless tobacco: Never Used  Substance Use Topics  . Alcohol use: Yes    Comment: occ  . Drug use: No     Allergies   Codeine   Review of Systems Review of Systems  Constitutional: Negative for activity  change and appetite change.  HENT: Negative for congestion, ear discharge, ear pain, facial swelling, nosebleeds, rhinorrhea, sneezing and tinnitus.   Eyes: Negative for photophobia, pain and discharge.  Respiratory: Negative for cough, choking, shortness of breath and wheezing.   Cardiovascular: Negative for chest pain, palpitations and leg swelling.  Gastrointestinal: Negative for abdominal pain, blood in stool, constipation, diarrhea, nausea and vomiting.  Genitourinary: Negative for difficulty urinating, dysuria, flank pain, frequency and hematuria.  Musculoskeletal: Positive for arthralgias. Negative for back pain, gait problem, myalgias and neck pain.  Skin: Negative for color change, rash and wound.  Neurological: Negative for dizziness, seizures, syncope, facial asymmetry, speech difficulty, weakness and numbness.  Hematological: Negative for adenopathy. Does not bruise/bleed easily.  Psychiatric/Behavioral: Negative for agitation,  confusion, hallucinations, self-injury and suicidal ideas. The patient is not nervous/anxious.      Physical Exam Updated Vital Signs BP (!) 141/107 (BP Location: Left Arm)   Pulse 91   Temp 97.8 F (36.6 C) (Oral)   Resp 17   LMP  (LMP Unknown) Comment: states she took a pill to stop periods  SpO2 99%   Physical Exam Vitals signs and nursing note reviewed.  Constitutional:      Appearance: She is well-developed. She is not toxic-appearing or diaphoretic.     Comments: Pt intoxicated, alertness comes and goes. Ankle splint in place by EMS.  HENT:     Head: Normocephalic.     Comments: Smell of ETOH on the breath.  No tender areas appreciated no abrasions and no bruising noted about the scalp.  Negative Battle's sign noted.  No bruises about the face.  No chipped teeth appreciated.  No trauma to the tongue.    Right Ear: Tympanic membrane and external ear normal.     Left Ear: Tympanic membrane and external ear normal.  Eyes:     General: Lids are normal.     Pupils: Pupils are equal, round, and reactive to light.  Neck:     Musculoskeletal: Normal range of motion and neck supple. No neck rigidity or muscular tenderness.     Vascular: No carotid bruit.  Cardiovascular:     Rate and Rhythm: Normal rate and regular rhythm.     Pulses: Normal pulses.     Heart sounds: Normal heart sounds.  Pulmonary:     Effort: No respiratory distress.     Breath sounds: Normal breath sounds.     Comments: There is symmetrical rise and fall of the chest.  The patient speaks in complete sentences without problem. No tenderness to the rib area.  No tenderness to the sternal area.  Chaperone present during the examination. Chest:     Comments: No rib area tenderness.  No sternal area tenderness.  Chaperone present during the examination. Abdominal:     General: Bowel sounds are normal.     Palpations: Abdomen is soft.     Tenderness: There is no abdominal tenderness. There is no guarding.   Musculoskeletal:        General: Swelling and tenderness present.     Left ankle: She exhibits decreased range of motion and deformity. Tenderness. Lateral malleolus and medial malleolus tenderness found. Achilles tendon normal.     Comments: No pain with movement or rocking of the pelvis.  There is full range of motion of upper and lower extremities.  There is full range of motion of the right lower extremity.  The left extremity is in a splint placed by EMS. Dorsalis pedis  pulses are 2+ bilaterally.  Capillary refill is less than 2 seconds.  Unable to complete examination of the Achilles tendon on the left due to patient's cooperation.  Lymphadenopathy:     Head:     Right side of head: No submandibular adenopathy.     Left side of head: No submandibular adenopathy.     Cervical: No cervical adenopathy.  Skin:    General: Skin is warm and dry.  Neurological:     Mental Status: She is oriented to person, place, and time and easily aroused.     Cranial Nerves: No cranial nerve deficit.     Sensory: No sensory deficit.  Psychiatric:        Speech: Speech normal.      ED Treatments / Results  Labs (all labs ordered are listed, but only abnormal results are displayed) Labs Reviewed - No data to display  EKG None  Radiology No results found.  Procedures  FRACTURE CARE LEFT ANKLE  .Ortho Injury Treatment  Date/Time: 03/10/2019 11:51 PM Performed by: Lily Kocher, PA-C Authorized by: Lily Kocher, PA-C   Consent:    Consent obtained:  Verbal   Consent given by:  Patient   Risks discussed:  Irreducible dislocation and stiffness Universal protocol:    Procedure explained and questions answered to patient or proxy's satisfaction: yes     Immediately prior to procedure a time out was called: yes     Patient identity confirmed:  Arm bandInjury location: ankle Location details: left ankle Injury type: fracture-dislocation Fracture type: bimalleolar Pre-procedure  neurovascular assessment: neurovascularly intact Pre-procedure distal perfusion: normal Pre-procedure neurological function: normal Pre-procedure range of motion: reduced Manipulation performed: yes Skin traction used: yes Reduction successful: no (reduction attempted, but went back to original position on reduction film.) X-ray confirmed reduction: yes Immobilization: splint and crutches Splint type: ankle stirrup and short leg Supplies used: cotton padding and Ortho-Glass Post-procedure neurovascular assessment: post-procedure neurovascularly intact Post-procedure distal perfusion: normal Post-procedure neurological function: normal Post-procedure range of motion: unchanged Patient tolerance: patient tolerated the procedure well with no immediate complications Comments: Dorsalis pedis pulse is 2+ after splint applied.  Capillary refill is less than 2 seconds.  No temperature changes noted after the splint was applied.  Marland KitchenSplint Application  Date/Time: 03/10/2019 11:54 PM Performed by: Lily Kocher, PA-C Authorized by: Lily Kocher, PA-C   Consent:    Consent obtained:  Verbal   Consent given by:  Patient   Risks discussed:  Pain and swelling Universal protocol:    Procedure explained and questions answered to patient or proxy's satisfaction: yes     Immediately prior to procedure a time out was called: yes     Patient identity confirmed:  Arm band Pre-procedure details:    Sensation:  Normal Procedure details:    Laterality:  Left   Location:  Ankle   Ankle:  L ankle   Splint type:  Ankle stirrup and short leg   Supplies:  Elastic bandage, Ortho-Glass and cotton padding Post-procedure details:    Pain:  Unchanged   Sensation:  Normal   Skin color:  Normal   Patient tolerance of procedure:  Tolerated well, no immediate complications   (including critical care time)  Medications Ordered in ED Medications - No data to display   Initial Impression / Assessment and  Plan / ED Course  I have reviewed the triage vital signs and the nursing notes.  Pertinent labs & imaging results that were available during my care of the  patient were reviewed by me and considered in my medical decision making (see chart for details).         Final Clinical Impressions(s) / ED Diagnoses MDM  Patient states she has been drinking beer and moonshine most of the night.  She fell out of a chair and injured the left ankle.  X-ray reveals An obliquely oriented fracture seen through the distal fibula at the level of the ankle mortise.  There is widening of the medial clear space.  There is also question of a medial malleolus avulsion fracture.  Attempts were made to reduce the fracture of the left ankle.  However postreduction films show that these were not successful.  Call placed to Dr. Stann Mainland for orthopedic consultation. Dr. Wyvonnia Dusky will continue patient's care.    Final diagnoses:  Bimalleolar fracture of left ankle, closed, initial encounter  Alcoholic intoxication without complication [facility])    ED Discharge Orders    None       Lily Kocher, PA-C 03/11/19 1159    Virgel Manifold, MD 03/11/19 1736

## 2019-03-10 NOTE — ED Triage Notes (Signed)
Pt c/o left ankle pain after falling. Pt has etoh on board.

## 2019-03-11 ENCOUNTER — Emergency Department (HOSPITAL_COMMUNITY): Payer: Self-pay

## 2019-03-11 MED ORDER — HYDROCODONE-ACETAMINOPHEN 5-325 MG PO TABS: 1.0000 | ORAL_TABLET | ORAL | 0 refills | Status: DC | PRN

## 2019-03-11 NOTE — Discharge Instructions (Signed)
Your ankle has a displaced fracture.  You need to see the bone doctor in the office because this may need surgery.  Keep the leg elevated and do not put weight on it.  Return to the ED with worsening pain, weakness, numbness, any other concerns.

## 2019-03-11 NOTE — ED Provider Notes (Signed)
   Reduction of fracture  Date/Time: 03/11/2019 4:37 AM Performed by: Ezequiel Essex, MD Authorized by: Ezequiel Essex, MD  Consent: The procedure was performed in an emergent situation. Verbal consent obtained. Risks and benefits: risks, benefits and alternatives were discussed Consent given by: patient Patient understanding: patient states understanding of the procedure being performed Patient consent: the patient's understanding of the procedure matches consent given Procedure consent: procedure consent matches procedure scheduled Relevant documents: relevant documents present and verified Test results: test results available and properly labeled Site marked: the operative site was marked Imaging studies: imaging studies available Patient identity confirmed: provided demographic data and verbally with patient Time out: Immediately prior to procedure a "time out" was called to verify the correct patient, procedure, equipment, support staff and site/side marked as required. Local anesthesia used: no  Anesthesia: Local anesthesia used: no  Sedation: Patient sedated: no  Patient tolerance: patient tolerated the procedure well with no immediate complications     MDM  Reduction not able to be maintained. Discussed with Dr. Stann Mainland.  Asked for ankle to be splinted at 90 degrees with knee flexed.  Patient intoxicated and able to tolerate reduction attempts without sedation. States 9 mm of displacement is acceptable. Mortise alignment unable to be maintained despite multiple attempts. He states ankle can be splinted as is without further reduction attempts.   This was done and ankle was resplinted as above.  There is really no change on her postreduction x-ray.  Patient to remain nonweightbearing and follow-up with Dr. Stann Mainland.  Patient is awake and alert. She denies any other injury. CT head is negative.  Discussed NWB on L leg and need for ortho followup.  When she has a sober  ride, she will be able to be discharged.       Ezequiel Essex, MD 03/11/19 [phone]

## 2019-03-16 ENCOUNTER — Other Ambulatory Visit (HOSPITAL_COMMUNITY)
Admission: RE | Admit: 2019-03-16 | Discharge: 2019-03-16 | Disposition: A | Discharge status: Home / Self Care | Payer: HRSA Program | Source: Ambulatory Visit | Attending: Orthopedic Surgery | Admitting: Orthopedic Surgery

## 2019-03-16 DIAGNOSIS — Z01812 Encounter for preprocedural laboratory examination: Secondary | ICD-10-CM | POA: Insufficient documentation

## 2019-03-16 DIAGNOSIS — Z20828 Contact with and (suspected) exposure to other viral communicable diseases: Secondary | ICD-10-CM | POA: Insufficient documentation

## 2019-03-16 LAB — SARS CORONAVIRUS 2 (TAT 6-24 HRS): SARS Coronavirus 2: NEGATIVE

## 2019-03-17 ENCOUNTER — Other Ambulatory Visit: Payer: Self-pay

## 2019-03-17 ENCOUNTER — Encounter (HOSPITAL_COMMUNITY): Payer: Self-pay | Admitting: *Deleted

## 2019-03-18 NOTE — Anesthesia Preprocedure Evaluation (Addendum)
Anesthesia Evaluation  Patient identified by MRN, date of birth, ID band Patient awake    Reviewed: Allergy & Precautions, NPO status , Patient's Chart, lab work & pertinent test results  History of Anesthesia Complications Negative for: history of anesthetic complications  Airway Mallampati: II  TM Distance: >3 FB Neck ROM: Full    Dental  (+) Missing,    Pulmonary neg pulmonary ROS,    Pulmonary exam normal        Cardiovascular negative cardio ROS Normal cardiovascular exam     Neuro/Psych negative neurological ROS     GI/Hepatic negative GI ROS, (+)     substance abuse  marijuana use,   Endo/Other  negative endocrine ROS  Renal/GU negative Renal ROS     Musculoskeletal  (+) Arthritis ,   Abdominal   Peds  Hematology negative hematology ROS (+)   Anesthesia Other Findings Day of surgery medications reviewed with the patient.  Reproductive/Obstetrics                            Anesthesia Physical Anesthesia Plan  ASA: II  Anesthesia Plan: General   Post-op Pain Management: GA combined w/ Regional for post-op pain   Induction: Intravenous  PONV Risk Score and Plan: 3 and Treatment may vary due to age or medical condition, Ondansetron, Dexamethasone and Midazolam  Airway Management Planned: LMA  Additional Equipment: None  Intra-op Plan:   Post-operative Plan: Extubation in OR  Informed Consent: I have reviewed the patients History and Physical, chart, labs and discussed the procedure including the risks, benefits and alternatives for the proposed anesthesia with the patient or authorized representative who has indicated his/her understanding and acceptance.     Dental advisory given  Plan Discussed with: CRNA  Anesthesia Plan Comments:        Anesthesia Quick Evaluation

## 2019-03-19 ENCOUNTER — Other Ambulatory Visit: Payer: Self-pay

## 2019-03-19 ENCOUNTER — Observation Stay (HOSPITAL_COMMUNITY)
Admission: RE | Admit: 2019-03-19 | Discharge: 2019-03-20 | Disposition: A | Discharge status: Home / Self Care | Payer: Self-pay | Attending: Orthopedic Surgery | Admitting: Orthopedic Surgery

## 2019-03-19 ENCOUNTER — Encounter (HOSPITAL_COMMUNITY): Payer: Self-pay

## 2019-03-19 ENCOUNTER — Encounter (HOSPITAL_COMMUNITY)
Admission: RE | Disposition: A | Discharge status: Home / Self Care | Payer: Self-pay | Source: Home / Self Care | Attending: Orthopedic Surgery

## 2019-03-19 ENCOUNTER — Ambulatory Visit (HOSPITAL_COMMUNITY): Payer: Self-pay | Admitting: Anesthesiology

## 2019-03-19 ENCOUNTER — Ambulatory Visit (HOSPITAL_COMMUNITY): Payer: Self-pay

## 2019-03-19 DIAGNOSIS — Z791 Long term (current) use of non-steroidal anti-inflammatories (NSAID): Secondary | ICD-10-CM | POA: Insufficient documentation

## 2019-03-19 DIAGNOSIS — Z801 Family history of malignant neoplasm of trachea, bronchus and lung: Secondary | ICD-10-CM | POA: Insufficient documentation

## 2019-03-19 DIAGNOSIS — Z79899 Other long term (current) drug therapy: Secondary | ICD-10-CM | POA: Insufficient documentation

## 2019-03-19 DIAGNOSIS — R4182 Altered mental status, unspecified: Secondary | ICD-10-CM | POA: Insufficient documentation

## 2019-03-19 DIAGNOSIS — F17290 Nicotine dependence, other tobacco product, uncomplicated: Secondary | ICD-10-CM | POA: Insufficient documentation

## 2019-03-19 DIAGNOSIS — M25472 Effusion, left ankle: Secondary | ICD-10-CM | POA: Insufficient documentation

## 2019-03-19 DIAGNOSIS — R05 Cough: Secondary | ICD-10-CM | POA: Insufficient documentation

## 2019-03-19 DIAGNOSIS — W1830XA Fall on same level, unspecified, initial encounter: Secondary | ICD-10-CM | POA: Insufficient documentation

## 2019-03-19 DIAGNOSIS — Z90710 Acquired absence of both cervix and uterus: Secondary | ICD-10-CM | POA: Insufficient documentation

## 2019-03-19 DIAGNOSIS — R6883 Chills (without fever): Secondary | ICD-10-CM | POA: Insufficient documentation

## 2019-03-19 DIAGNOSIS — M1990 Unspecified osteoarthritis, unspecified site: Secondary | ICD-10-CM | POA: Insufficient documentation

## 2019-03-19 DIAGNOSIS — S8262XA Displaced fracture of lateral malleolus of left fibula, initial encounter for closed fracture: Principal | ICD-10-CM | POA: Insufficient documentation

## 2019-03-19 DIAGNOSIS — S82892A Other fracture of left lower leg, initial encounter for closed fracture: Secondary | ICD-10-CM | POA: Diagnosis present

## 2019-03-19 DIAGNOSIS — Z8249 Family history of ischemic heart disease and other diseases of the circulatory system: Secondary | ICD-10-CM | POA: Insufficient documentation

## 2019-03-19 DIAGNOSIS — Z833 Family history of diabetes mellitus: Secondary | ICD-10-CM | POA: Insufficient documentation

## 2019-03-19 DIAGNOSIS — R011 Cardiac murmur, unspecified: Secondary | ICD-10-CM | POA: Insufficient documentation

## 2019-03-19 DIAGNOSIS — Z419 Encounter for procedure for purposes other than remedying health state, unspecified: Secondary | ICD-10-CM

## 2019-03-19 DIAGNOSIS — Z885 Allergy status to narcotic agent status: Secondary | ICD-10-CM | POA: Insufficient documentation

## 2019-03-19 DIAGNOSIS — Z9049 Acquired absence of other specified parts of digestive tract: Secondary | ICD-10-CM | POA: Insufficient documentation

## 2019-03-19 DIAGNOSIS — S93432A Sprain of tibiofibular ligament of left ankle, initial encounter: Secondary | ICD-10-CM | POA: Insufficient documentation

## 2019-03-19 HISTORY — PX: ORIF ANKLE FRACTURE: SHX5408

## 2019-03-19 LAB — BASIC METABOLIC PANEL
Anion gap: 11 (ref 5–15)
BUN: 6 mg/dL (ref 6–20)
CO2: 27 mmol/L (ref 22–32)
Calcium: 9.4 mg/dL (ref 8.9–10.3)
Chloride: 99 mmol/L (ref 98–111)
Creatinine, Ser: 0.61 mg/dL (ref 0.44–1.00)
GFR calc Af Amer: >60 mL/min (ref >60)
GFR calc non Af Amer: >60 mL/min (ref >60)
Glucose, Bld: 88 mg/dL (ref 70–99)
Potassium: 3.7 mmol/L (ref 3.5–5.1)
Sodium: 137 mmol/L (ref 135–145)

## 2019-03-19 LAB — CBC
HCT: 45.9 % (ref 36.0–46.0)
Hemoglobin: 14.4 g/dL (ref 12.0–15.0)
MCH: 33.7 pg (ref 26.0–34.0)
MCHC: 31.4 g/dL (ref 30.0–36.0)
MCV: 107.5 fL — ABNORMAL HIGH (ref 80.0–100.0)
Platelets: 368 10*3/uL (ref 150–400)
RBC: 4.27 MIL/uL (ref 3.87–5.11)
RDW: 13.5 % (ref 11.5–15.5)
WBC: 7.1 10*3/uL (ref 4.0–10.5)
nRBC: 0 % (ref 0.0–0.2)

## 2019-03-19 SURGERY — OPEN REDUCTION INTERNAL FIXATION (ORIF) ANKLE FRACTURE
Anesthesia: Regional | Site: Ankle | Laterality: Left

## 2019-03-19 MED ORDER — ACETAMINOPHEN 10 MG/ML IV SOLN: 1000.0000 mg | Freq: Once | INTRAVENOUS | Status: DC | PRN

## 2019-03-19 MED ORDER — 0.9 % SODIUM CHLORIDE (POUR BTL) OPTIME
TOPICAL | Status: DC | PRN
  Administered 2019-03-19: 1000 mL

## 2019-03-19 MED ORDER — FENTANYL CITRATE (PF) 250 MCG/5ML IJ SOLN
INTRAMUSCULAR | Status: AC
  Filled 2019-03-19: qty 5

## 2019-03-19 MED ORDER — FENTANYL CITRATE (PF) 100 MCG/2ML IJ SOLN
INTRAMUSCULAR | Status: AC
  Administered 2019-03-19: 09:00:00 100 ug via INTRAVENOUS
  Filled 2019-03-19: qty 2

## 2019-03-19 MED ORDER — MIDAZOLAM HCL 2 MG/2ML IJ SOLN
2.0000 mg | Freq: Once | INTRAMUSCULAR | Status: AC
  Administered 2019-03-19: 2 mg via INTRAVENOUS

## 2019-03-19 MED ORDER — MIDAZOLAM HCL 2 MG/2ML IJ SOLN
INTRAMUSCULAR | Status: AC
  Filled 2019-03-19: qty 2

## 2019-03-19 MED ORDER — HYDROCODONE-ACETAMINOPHEN 7.5-325 MG PO TABS
1.0000 | ORAL_TABLET | ORAL | Status: DC | PRN
  Administered 2019-03-19 – 2019-03-20 (×5): 1 via ORAL
  Filled 2019-03-19 (×5): qty 1

## 2019-03-19 MED ORDER — FENTANYL CITRATE (PF) 100 MCG/2ML IJ SOLN
100.0000 ug | Freq: Once | INTRAMUSCULAR | Status: AC
  Administered 2019-03-19: 09:00:00 100 ug via INTRAVENOUS

## 2019-03-19 MED ORDER — CEFAZOLIN SODIUM-DEXTROSE 2-4 GM/100ML-% IV SOLN
INTRAVENOUS | Status: AC
  Filled 2019-03-19: qty 100

## 2019-03-19 MED ORDER — ONDANSETRON HCL 4 MG/2ML IJ SOLN: 4.0000 mg | Freq: Four times a day (QID) | INTRAMUSCULAR | Status: DC | PRN

## 2019-03-19 MED ORDER — LIDOCAINE 2% (20 MG/ML) 5 ML SYRINGE
INTRAMUSCULAR | Status: AC
  Filled 2019-03-19: qty 5

## 2019-03-19 MED ORDER — FENTANYL CITRATE (PF) 100 MCG/2ML IJ SOLN: 25.0000 ug | INTRAMUSCULAR | Status: DC | PRN

## 2019-03-19 MED ORDER — GLYCOPYRROLATE PF 0.2 MG/ML IJ SOSY
PREFILLED_SYRINGE | INTRAMUSCULAR | Status: DC | PRN
  Administered 2019-03-19: .1 mg via INTRAVENOUS

## 2019-03-19 MED ORDER — PROPOFOL 10 MG/ML IV BOLUS
INTRAVENOUS | Status: AC
  Filled 2019-03-19: qty 20

## 2019-03-19 MED ORDER — CHLORHEXIDINE GLUCONATE 4 % EX LIQD: 60.0000 mL | Freq: Once | CUTANEOUS | Status: DC

## 2019-03-19 MED ORDER — METHOCARBAMOL 500 MG PO TABS
500.0000 mg | ORAL_TABLET | Freq: Four times a day (QID) | ORAL | Status: DC | PRN
  Administered 2019-03-19 – 2019-03-20 (×2): 500 mg via ORAL
  Filled 2019-03-19 (×2): qty 1

## 2019-03-19 MED ORDER — ENOXAPARIN SODIUM 40 MG/0.4ML ~~LOC~~ SOLN
40.0000 mg | SUBCUTANEOUS | Status: DC
  Administered 2019-03-19: 40 mg via SUBCUTANEOUS
  Filled 2019-03-19 (×3): qty 0.4

## 2019-03-19 MED ORDER — ONDANSETRON HCL 4 MG/2ML IJ SOLN
INTRAMUSCULAR | Status: AC
  Filled 2019-03-19: qty 2

## 2019-03-19 MED ORDER — PROMETHAZINE HCL 25 MG/ML IJ SOLN: 6.2500 mg | INTRAMUSCULAR | Status: DC | PRN

## 2019-03-19 MED ORDER — SUCCINYLCHOLINE CHLORIDE 200 MG/10ML IV SOSY
PREFILLED_SYRINGE | INTRAVENOUS | Status: AC
  Filled 2019-03-19: qty 10

## 2019-03-19 MED ORDER — METHOCARBAMOL 1000 MG/10ML IJ SOLN
500.0000 mg | Freq: Four times a day (QID) | INTRAVENOUS | Status: DC | PRN
  Filled 2019-03-19: qty 5

## 2019-03-19 MED ORDER — DEXAMETHASONE SODIUM PHOSPHATE 10 MG/ML IJ SOLN
INTRAMUSCULAR | Status: AC
  Filled 2019-03-19: qty 1

## 2019-03-19 MED ORDER — LIDOCAINE 2% (20 MG/ML) 5 ML SYRINGE
INTRAMUSCULAR | Status: DC | PRN
  Administered 2019-03-19: 100 mg via INTRAVENOUS

## 2019-03-19 MED ORDER — MORPHINE SULFATE (PF) 2 MG/ML IV SOLN: 0.5000 mg | INTRAVENOUS | Status: DC | PRN

## 2019-03-19 MED ORDER — PROPOFOL 10 MG/ML IV BOLUS
INTRAVENOUS | Status: DC | PRN
  Administered 2019-03-19: 30 mg via INTRAVENOUS
  Administered 2019-03-19: 170 mg via INTRAVENOUS

## 2019-03-19 MED ORDER — CEFAZOLIN SODIUM-DEXTROSE 2-4 GM/100ML-% IV SOLN
2.0000 g | INTRAVENOUS | Status: AC
  Administered 2019-03-19: 2 g via INTRAVENOUS

## 2019-03-19 MED ORDER — FENTANYL CITRATE (PF) 100 MCG/2ML IJ SOLN
INTRAMUSCULAR | Status: DC | PRN
  Administered 2019-03-19: 50 ug via INTRAVENOUS

## 2019-03-19 MED ORDER — DEXAMETHASONE SODIUM PHOSPHATE 4 MG/ML IJ SOLN
INTRAMUSCULAR | Status: DC | PRN
  Administered 2019-03-19: 8 mg via INTRAVENOUS

## 2019-03-19 MED ORDER — ACETAMINOPHEN 325 MG PO TABS: 325.0000 mg | ORAL_TABLET | Freq: Four times a day (QID) | ORAL | Status: DC | PRN

## 2019-03-19 MED ORDER — LACTATED RINGERS IV SOLN
INTRAVENOUS | Status: DC | PRN
  Administered 2019-03-19: 08:00:00 via INTRAVENOUS

## 2019-03-19 MED ORDER — ONDANSETRON HCL 4 MG/2ML IJ SOLN
INTRAMUSCULAR | Status: DC | PRN
  Administered 2019-03-19: 4 mg via INTRAVENOUS

## 2019-03-19 MED ORDER — HYDROCODONE-ACETAMINOPHEN 5-325 MG PO TABS: 1.0000 | ORAL_TABLET | ORAL | Status: DC | PRN

## 2019-03-19 MED ORDER — MIDAZOLAM HCL 2 MG/2ML IJ SOLN
INTRAMUSCULAR | Status: AC
  Administered 2019-03-19: 09:00:00 2 mg via INTRAVENOUS
  Filled 2019-03-19: qty 2

## 2019-03-19 MED ORDER — PHENYLEPHRINE 40 MCG/ML (10ML) SYRINGE FOR IV PUSH (FOR BLOOD PRESSURE SUPPORT)
PREFILLED_SYRINGE | INTRAVENOUS | Status: AC
  Filled 2019-03-19: qty 10

## 2019-03-19 MED ORDER — GLYCOPYRROLATE PF 0.2 MG/ML IJ SOSY
PREFILLED_SYRINGE | INTRAMUSCULAR | Status: AC
  Filled 2019-03-19: qty 1

## 2019-03-19 MED ORDER — ACETAMINOPHEN 500 MG PO TABS
500.0000 mg | ORAL_TABLET | Freq: Four times a day (QID) | ORAL | Status: AC
  Administered 2019-03-19 – 2019-03-20 (×4): 500 mg via ORAL
  Filled 2019-03-19 (×4): qty 1

## 2019-03-19 MED ORDER — NAPROXEN 250 MG PO TABS
250.0000 mg | ORAL_TABLET | Freq: Two times a day (BID) | ORAL | Status: DC
  Administered 2019-03-19 – 2019-03-20 (×2): 250 mg via ORAL
  Filled 2019-03-19 (×2): qty 1

## 2019-03-19 MED ORDER — GABAPENTIN 300 MG PO CAPS
300.0000 mg | ORAL_CAPSULE | Freq: Three times a day (TID) | ORAL | Status: DC
  Administered 2019-03-19 – 2019-03-20 (×3): 300 mg via ORAL
  Filled 2019-03-19 (×3): qty 1

## 2019-03-19 MED ORDER — BUPIVACAINE-EPINEPHRINE (PF) 0.5% -1:200000 IJ SOLN
INTRAMUSCULAR | Status: DC | PRN
  Administered 2019-03-19: 20 mL via PERINEURAL
  Administered 2019-03-19: 10 mL via PERINEURAL

## 2019-03-19 MED ORDER — ONDANSETRON HCL 4 MG PO TABS: 4.0000 mg | ORAL_TABLET | Freq: Four times a day (QID) | ORAL | Status: DC | PRN

## 2019-03-19 MED ORDER — LACTATED RINGERS IV SOLN
INTRAVENOUS | Status: DC
  Administered 2019-03-19: 08:00:00 via INTRAVENOUS

## 2019-03-19 MED ORDER — MIDAZOLAM HCL 5 MG/5ML IJ SOLN
INTRAMUSCULAR | Status: DC | PRN
  Administered 2019-03-19: 2 mg via INTRAVENOUS

## 2019-03-19 SURGICAL SUPPLY — 66 items
ALCOHOL 70% 16 OZ (MISCELLANEOUS) ×3 IMPLANT
BANDAGE ESMARK 6X9 LF (GAUZE/BANDAGES/DRESSINGS) IMPLANT
BIT DRILL 2 CANN GRADUATED (BIT) ×3 IMPLANT
BIT DRILL 2.5 CANN LNG (BIT) ×3 IMPLANT
BIT DRILL 2.7 (BIT) ×2
BIT DRILL 2.7X2.7/3XSCR ANKL (BIT) ×1 IMPLANT
BIT DRL 2.7X2.7/3XSCR ANKL (BIT) ×1
BNDG COHESIVE 4X5 TAN STRL (GAUZE/BANDAGES/DRESSINGS) ×3 IMPLANT
BNDG ELASTIC 4X5.8 VLCR STR LF (GAUZE/BANDAGES/DRESSINGS) ×3 IMPLANT
BNDG ELASTIC 6X10 VLCR STRL LF (GAUZE/BANDAGES/DRESSINGS) ×3 IMPLANT
BNDG ELASTIC 6X5.8 VLCR STR LF (GAUZE/BANDAGES/DRESSINGS) IMPLANT
BNDG ESMARK 6X9 LF (GAUZE/BANDAGES/DRESSINGS)
CANISTER SUCT 3000ML PPV (MISCELLANEOUS) ×3 IMPLANT
COVER SURGICAL LIGHT HANDLE (MISCELLANEOUS) ×3 IMPLANT
COVER WAND RF STERILE (DRAPES) ×3 IMPLANT
CUFF TOURN SGL QUICK 34 (TOURNIQUET CUFF)
CUFF TRNQT CYL 34X4.125X (TOURNIQUET CUFF) IMPLANT
DRAPE C-ARM 42X72 X-RAY (DRAPES) ×3 IMPLANT
DRAPE C-ARMOR (DRAPES) ×3 IMPLANT
DRAPE U-SHAPE 47X51 STRL (DRAPES) ×3 IMPLANT
DRSG ADAPTIC 3X8 NADH LF (GAUZE/BANDAGES/DRESSINGS) ×3 IMPLANT
DRSG PAD ABDOMINAL 8X10 ST (GAUZE/BANDAGES/DRESSINGS) ×3 IMPLANT
DURAPREP 26ML APPLICATOR (WOUND CARE) ×6 IMPLANT
ELECT REM PT RETURN 9FT ADLT (ELECTROSURGICAL) ×3
ELECTRODE REM PT RTRN 9FT ADLT (ELECTROSURGICAL) ×1 IMPLANT
GAUZE SPONGE 4X4 12PLY STRL (GAUZE/BANDAGES/DRESSINGS) ×3 IMPLANT
GLOVE BIO SURGEON STRL SZ7.5 (GLOVE) ×3 IMPLANT
GLOVE BIOGEL PI IND STRL 8 (GLOVE) ×1 IMPLANT
GLOVE BIOGEL PI INDICATOR 8 (GLOVE) ×2
GOWN STRL REUS W/ TWL LRG LVL3 (GOWN DISPOSABLE) ×4 IMPLANT
GOWN STRL REUS W/ TWL XL LVL3 (GOWN DISPOSABLE) ×1 IMPLANT
GOWN STRL REUS W/TWL LRG LVL3 (GOWN DISPOSABLE) ×8
GOWN STRL REUS W/TWL XL LVL3 (GOWN DISPOSABLE) ×2
IMPL TIGHTROP W/DRV K-LESS (Anchor) ×1 IMPLANT
IMPLANT TIGHTROPE W/DRV K-LESS (Anchor) ×3 IMPLANT
KIT BASIN OR (CUSTOM PROCEDURE TRAY) ×3 IMPLANT
KIT TURNOVER KIT B (KITS) ×3 IMPLANT
NEEDLE HYPO 25GX1X1/2 BEV (NEEDLE) IMPLANT
NS IRRIG 1000ML POUR BTL (IV SOLUTION) ×3 IMPLANT
PACK ORTHO EXTREMITY (CUSTOM PROCEDURE TRAY) ×3 IMPLANT
PAD ABD 8X10 STRL (GAUZE/BANDAGES/DRESSINGS) ×3 IMPLANT
PAD ARMBOARD 7.5X6 YLW CONV (MISCELLANEOUS) ×6 IMPLANT
PAD CAST 4YDX4 CTTN HI CHSV (CAST SUPPLIES) ×2 IMPLANT
PADDING CAST COTTON 4X4 STRL (CAST SUPPLIES) ×4
PADDING CAST COTTON 6X4 STRL (CAST SUPPLIES) ×3 IMPLANT
PADDING CAST SYNTHETIC 4 (CAST SUPPLIES) ×2
PADDING CAST SYNTHETIC 4X4 STR (CAST SUPPLIES) ×1 IMPLANT
PLATE LOCKING STR 7HOLE (Plate) ×3 IMPLANT
SCREW LO PRO 2.7X22MM CORTEX (Screw) ×3 IMPLANT
SCREW LOW PROFILE 3.5X14 (Screw) ×9 IMPLANT
SCREW NLOCK T15 FT 18X3.5XST (Screw) ×2 IMPLANT
SCREW NON LOCK 3.5X18MM (Screw) ×4 IMPLANT
SPLINT FIBERGLASS 4X30 (CAST SUPPLIES) ×3 IMPLANT
SPONGE LAP 18X18 RF (DISPOSABLE) IMPLANT
SUCTION FRAZIER HANDLE 10FR (MISCELLANEOUS) ×2
SUCTION TUBE FRAZIER 10FR DISP (MISCELLANEOUS) ×1 IMPLANT
SUT ETHILON 3 0 PS 1 (SUTURE) ×6 IMPLANT
SUT VIC AB 0 CT1 27 (SUTURE) ×2
SUT VIC AB 0 CT1 27XBRD ANBCTR (SUTURE) ×1 IMPLANT
SUT VIC AB 2-0 CT1 27 (SUTURE) ×2
SUT VIC AB 2-0 CT1 TAPERPNT 27 (SUTURE) ×1 IMPLANT
SYR CONTROL 10ML LL (SYRINGE) IMPLANT
TOWEL GREEN STERILE (TOWEL DISPOSABLE) ×6 IMPLANT
TUBE CONNECTING 12'X1/4 (SUCTIONS) ×1
TUBE CONNECTING 12X1/4 (SUCTIONS) ×2 IMPLANT
YANKAUER SUCT BULB TIP NO VENT (SUCTIONS) ×3 IMPLANT

## 2019-03-19 NOTE — Transfer of Care (Signed)
Immediate Anesthesia Transfer of Care Note  Patient: Mallory Neal  Procedure(s) Performed: OPEN REDUCTION INTERNAL FIXATION (ORIF) ANKLE WITH SYNDESMOSIS FIXATION (Left Ankle)  Patient Location: PACU  Anesthesia Type:General and Regional  Level of Consciousness: awake and patient cooperative  Airway & Oxygen Therapy: Patient Spontanous Breathing and Patient connected to face mask oxygen  Post-op Assessment: Report given to RN and Post -op Vital signs reviewed and stable  Post vital signs: Reviewed and stable  Last Vitals:  Vitals Value Taken Time  BP    Temp    Pulse    Resp    SpO2      Last Pain:  Vitals:   03/19/19 0914  TempSrc:   PainSc: 0-No pain         Complications: No apparent anesthesia complications

## 2019-03-19 NOTE — Brief Op Note (Signed)
03/19/2019  11:54 AM  PATIENT:  Mallory Neal  48 y.o. female  PRE-OPERATIVE DIAGNOSIS:  Left ankle biomalleolar fracture  POST-OPERATIVE DIAGNOSIS:  Left ankle biomalleolar fracture  PROCEDURE:  Procedure(s) with comments: OPEN REDUCTION INTERNAL FIXATION (ORIF) ANKLE WITH SYNDESMOSIS FIXATION (Left) - 90 MINS  SURGEON:  Surgeon(s) and Role:    * Stann Mainland, Elly Modena, MD - Primary  PHYSICIAN ASSISTANT:   ASSISTANTS: Katy Apo, RNFA   ANESTHESIA:   regional and general  EBL:  30 cc  BLOOD ADMINISTERED:none  DRAINS: none   LOCAL MEDICATIONS USED:  NONE  SPECIMEN:  No Specimen  DISPOSITION OF SPECIMEN:  N/A  COUNTS:  YES  TOURNIQUET:   Total Tourniquet Time Documented: Thigh (Left) - 56 minutes Total: Thigh (Left) - 56 minutes   DICTATION: .Note written in EPIC  PLAN OF CARE: Admit for overnight observation  PATIENT DISPOSITION:  PACU - hemodynamically stable.   Delay start of Pharmacological VTE agent (>24hrs) due to surgical blood loss or risk of bleeding: not applicable

## 2019-03-19 NOTE — Anesthesia Procedure Notes (Signed)
Procedure Name: LMA Insertion Date/Time: 03/19/2019 10:31 AM Performed by: Orlie Dakin, CRNA Pre-anesthesia Checklist: Patient identified, Emergency Drugs available, Suction available and Patient being monitored Patient Re-evaluated:Patient Re-evaluated prior to induction Oxygen Delivery Method: Circle system utilized Preoxygenation: Pre-oxygenation with 100% oxygen Induction Type: IV induction LMA: LMA inserted LMA Size: 4.0 Tube type: Oral Number of attempts: 1 Placement Confirmation: positive ETCO2 Tube secured with: Tape Comments: Noted "chew" on teeth in Short-Stay.  Last chewed this a.m.   Patient took out in [place] per Dr Daiva Huge.

## 2019-03-19 NOTE — Anesthesia Procedure Notes (Signed)
Anesthesia Regional Block: Adductor canal block   Pre-Anesthetic Checklist: ,, timeout performed, Correct Patient, Correct Site, Correct Laterality, Correct Procedure, Correct Position, site marked, Risks and benefits discussed, pre-op evaluation,  At surgeon's request and post-op pain management  Laterality: Left  Prep: Maximum Sterile Barrier Precautions used, chloraprep       Needles:  Injection technique: Single-shot  Needle Type: Echogenic Stimulator Needle     Needle Length: 9cm  Needle Gauge: 22     Additional Needles:   Procedures:,,,, ultrasound used (permanent image in chart),,,,  Narrative:  Start time: 03/19/2019 8:57 AM End time: 03/19/2019 8:59 AM Injection made incrementally with aspirations every 5 mL.  Performed by: Personally  Anesthesiologist: Brennan Bailey, MD  Additional Notes: Risks, benefits, and alternative discussed. Patient gave consent for procedure. Patient prepped and draped in sterile fashion. Sedation administered, patient remains easily responsive to voice. Relevant anatomy identified with ultrasound guidance. Local anesthetic given in 5cc increments with no signs or symptoms of intravascular injection. No pain or paraesthesias with injection. Patient monitored throughout procedure with signs of LAST or immediate complications. Tolerated well. Ultrasound image placed in chart.  Tawny Asal, MD

## 2019-03-19 NOTE — H&P (Signed)
ORTHOPAEDIC H and P  REQUESTING PHYSICIAN: [name], MD  PCP:  [name]  Chief Complaint: Left ankle fracture  HPI: Mallory Neal is a 48 y.o. female who complains of left ankle pain following a ground-level fall.  She sustained a left ankle fracture with syndesmosis disruption.  She is here today for operative management including internal fixation of both.  No new complaints.  She has been compliant with nonweightbearing.  Past Medical History:  Diagnosis Date  . Anemia   . Arthritis   . Chest pain   . Heart murmur    Past Surgical History:  Procedure Laterality Date  . ABDOMINAL HYSTERECTOMY N/A 12/05/2016   Procedure: HYSTERECTOMY ABDOMINAL;  Surgeon: Florian Buff, MD;  Location: [facility];  Service: Gynecology;  Laterality: N/A;  . CHOLECYSTECTOMY    . SALPINGOOPHORECTOMY Bilateral 12/05/2016   Procedure: SALPINGO OOPHORECTOMY;  Surgeon: Florian Buff, MD;  Location: [facility];  Service: Gynecology;  Laterality: Bilateral;   Social History   Socioeconomic History  . Marital status: Single    Spouse name: Not on file  . Number of children: Not on file  . Years of education: Not on file  . Highest education level: Not on file  Occupational History  . Not on file  Social Needs  . Financial resource strain: Not on file  . Food insecurity    Worry: Not on file    Inability: Not on file  . Transportation needs    Medical: Not on file    Non-medical: Not on file  Tobacco Use  . Smoking status: Never Smoker  . Smokeless tobacco: Current User    Types: Snuff  Substance and Sexual Activity  . Alcohol use: Yes    Comment: occ  . Drug use: Yes    Types: Marijuana    Comment: 1-2x a week  . Sexual activity: Not Currently    Birth control/protection: None  Lifestyle  . Physical activity    Days per week: Not on file    Minutes per session: Not on file  . Stress: Not on file  Relationships  . Social Herbalist on  phone: Not on file    Gets together: Not on file    Attends religious service: Not on file    Active member of club or organization: Not on file    Attends meetings of clubs or organizations: Not on file    Relationship status: Not on file  Other Topics Concern  . Not on file  Social History Narrative  . Not on file   Family History  Problem Relation Age of Onset  . Diabetes Paternal Grandfather   . Heart disease Paternal Grandmother   . Heart disease Maternal Grandmother   . Cancer Father        lung   Allergies  Allergen Reactions  . Codeine Hives, Shortness Of Breath and Rash    Tolerates hydrocodone    Prior to Admission medications   Medication Sig Start Date End Date Taking? Authorizing Provider  HYDROcodone-acetaminophen (NORCO/VICODIN) 5-325 MG tablet Take 1 tablet by mouth every 4 (four) hours as needed. Patient taking differently: Take 1 tablet by mouth every 6 (six) hours as needed for moderate pain.  03/11/19  Yes Rancour, Annie Main, MD  methocarbamol (ROBAXIN) 500 MG tablet Take 2 tablets (1,000 mg total) by mouth 4 (four) times daily as needed for muscle spasms (muscle spasm/pain). Patient taking differently: Take  500 mg by mouth 4 (four) times daily as needed for muscle spasms (muscle spasm/pain).  02/24/19  Yes Francine Graven, DO  naproxen (NAPROSYN) 250 MG tablet Take 1 tablet (250 mg total) by mouth 2 (two) times daily as needed for mild pain or moderate pain (take with food). Patient not taking: Reported on 03/17/2019 02/24/19   Francine Graven, DO   No results found.  Positive ROS: All other systems have been reviewed and were otherwise negative with the exception of those mentioned in the HPI and as above.  Physical Exam: General: Alert, no acute distress Cardiovascular: No pedal edema Respiratory: No cyanosis, no use of accessory musculature GI: No organomegaly, abdomen is soft and non-tender Skin: No lesions in the area of chief complaint Neurologic:  Sensation intact distally Psychiatric: Patient is competent for consent with normal mood and affect Lymphatic: No axillary or cervical lymphadenopathy  MUSCULOSKELETAL:  Short leg splint on left ankle.  Toes warm and well-perfused.  No pain with passive stretch.  Assessment: #1.  Left distal fibula fracture  2.  Left distal tibiofibular disruption  Plan: -Plan for open reduction internal fixation of both the distal fibula and distal syndesmosis.  We again reviewed the risk, benefits, and indications of this procedure.  All questions were solicited and answered to her satisfaction.    [name], MD Cell [phone]    03/19/2019 9:41 AM

## 2019-03-19 NOTE — Anesthesia Procedure Notes (Signed)
Anesthesia Regional Block: Popliteal block   Pre-Anesthetic Checklist: ,, timeout performed, Correct Patient, Correct Site, Correct Laterality, Correct Procedure, Correct Position, site marked, Risks and benefits discussed, pre-op evaluation,  At surgeon's request and post-op pain management  Laterality: Left  Prep: Maximum Sterile Barrier Precautions used, chloraprep       Needles:  Injection technique: Single-shot  Needle Type: Echogenic Stimulator Needle     Needle Length: 9cm  Needle Gauge: 22     Additional Needles:   Procedures:,,,, ultrasound used (permanent image in chart),,,,  Narrative:  Start time: 03/19/2019 9:00 AM End time: 03/19/2019 9:02 AM Injection made incrementally with aspirations every 5 mL.  Performed by: Personally  Anesthesiologist: Brennan Bailey, MD  Additional Notes: Risks, benefits, and alternative discussed. Patient gave consent for procedure. Patient prepped and draped in sterile fashion. Sedation administered, patient remains easily responsive to voice. Relevant anatomy identified with ultrasound guidance. Local anesthetic given in 5cc increments with no signs or symptoms of intravascular injection. No pain or paraesthesias with injection. Patient monitored throughout procedure with signs of LAST or immediate complications. Tolerated well. Ultrasound image placed in chart.  Tawny Asal, MD

## 2019-03-19 NOTE — Op Note (Signed)
Date of Surgery: 03/19/2019  INDICATIONS: Mallory Neal is a 48 y.o.-year-old female who sustained a left ankle fracture; she was indicated for open reduction and internal fixation due to the displaced nature of the articular fracture and came to the operating room today for this procedure. The patient did consent to the procedure after discussion of the risks and benefits.  PREOPERATIVE DIAGNOSIS: left closed ankle fracture  POSTOPERATIVE DIAGNOSIS: Same.  PROCEDURE:  1. Open treatment of left ankle fracture with internal fixation. Lateral malleolar CPT [id number] 2. Open reduction and internal fixation distal tibio-fibular joint (syndesmosis)  SURGEON: Emry Tobin P. Stann Mainland, M.D.  ASSIST: Katy Apo, RNFA.  ANESTHESIA:  general  TOURNIQUET TIME: 50 min at 300 mmHg  IV FLUIDS AND URINE: See anesthesia.  ESTIMATED BLOOD LOSS: 30 mL.  IMPLANTS:  Arthrex stainless steel fibula plate with nonlocking cortical screws throughout  1 tight rope device  COMPLICATIONS: None.  DESCRIPTION OF PROCEDURE: The patient was brought to the operating room and placed supine on the operating table.  The patient had been signed prior to the procedure and this was documented. The patient had the anesthesia placed by the anesthesiologist.  A nonsterile tourniquet was placed on the upper thigh.  The prep verification and incision time-outs were performed to confirm that this was the correct patient, site, side and location. The patient had an SCD on the opposite lower extremity. The patient did receive antibiotics prior to the incision and was re-dosed during the procedure as needed at indicated intervals.  The patient had the lower extremity prepped and draped in the standard surgical fashion.  The extremity was exsanguinated using an esmarch bandage and the tourniquet was inflated to 300 mm Hg.   Incision was made over the distal fibula and the fracture was exposed and reduced anatomically with a clamp. A lag screw  was placed. I then applied a 1/3 tubular locking plate and secured it proximally and distally with non-locking screws. Bone quality was good. I used c-arm to confirm satisfactory reduction and fixation.     The syndesmosis was stressed using live fluoroscopy and found to be grossly unstable.   We then moved to stabilize the distal tibiofibular joint.  Using mortise view with intraoperative fluoroscopy we aligned the drill guide for a 4 cortices drill.  This was drilled from lateral fibula across to the needle tibia.  The mortise was congruent.  We did then ensure that the fibula was situated in the incisura.  Next we placed the Arthrex tight rope device across into the medial cortex of the tibia.  We then used the device to secure the fibula and the incisura.  This was noted to have good fixation.  We then stressed intraoperatively and found that the mortise was indeed stable.   Next we obtained intraoperative FluoroScan scopic views of the ankle 3 views with a stress view.  This AP, mortise, lateral.  Views did indicate adequate reduction of the fibula and syndesmosis.    The wounds were irrigated, and closed with vicryl with routine closure for the skin. The wounds were injected with local anesthetic. Sterile gauze was applied followed by a posterior splint. She was awakened and returned to the PACU in stable and satisfactory condition. There were no complications.    POSTOPERATIVE PLAN: Mallory Neal will remain nonweightbearing on this leg for approximately 8 weeks; Mallory Neal will return for suture removal in 2 weeks.  She will be immobilized in a short leg splint and then transitioned to  a CAM walker at his first follow up appointment.  Mallory Neal will receive DVT prophylaxis based on other medications, activity level, and risk ratio of bleeding to thrombosis.  She will be admitted to observation and likely discharge home tomorrow am.  Geralynn Rile, MD [facility] [phone] 11:57  AM

## 2019-03-19 NOTE — Anesthesia Postprocedure Evaluation (Signed)
Anesthesia Post Note  Patient: Mallory Neal  Procedure(s) Performed: OPEN REDUCTION INTERNAL FIXATION (ORIF) ANKLE WITH SYNDESMOSIS FIXATION (Left Ankle)     Patient location during evaluation: PACU Anesthesia Type: General Level of consciousness: awake and alert and oriented Pain management: pain level controlled Vital Signs Assessment: post-procedure vital signs reviewed and stable Respiratory status: spontaneous breathing, nonlabored ventilation and respiratory function stable Cardiovascular status: blood pressure returned to baseline Postop Assessment: no apparent nausea or vomiting Anesthetic complications: no    Last Vitals:  Vitals:   03/19/19 1254 03/19/19 1255  BP: (!) 141/76 (!) 141/76  Pulse: 62 62  Resp: 20 20  Temp: 36.7 C 36.7 C  SpO2:  99%    Last Pain:  Vitals:   03/19/19 1255  TempSrc: Oral  PainSc:                  Brennan Bailey

## 2019-03-20 LAB — HIV ANTIBODY (ROUTINE TESTING W REFLEX): HIV Screen 4th Generation wRfx: NONREACTIVE

## 2019-03-20 MED ORDER — OXYCODONE HCL 5 MG PO TABS: 5.0000 mg | ORAL_TABLET | ORAL | 0 refills | Status: AC | PRN

## 2019-03-20 MED ORDER — ONDANSETRON 4 MG PO TBDP: 4.0000 mg | ORAL_TABLET | Freq: Three times a day (TID) | ORAL | 0 refills | Status: DC | PRN

## 2019-03-20 NOTE — Discharge Instructions (Signed)
Orthopedic discharge instructions: -Elevate the left lower extremity with your "toes above nose." -No weightbearing to the left lower extremity -Maintain your splint clean, and dry at all times.  -Return to see Dr. Stann Mainland in 2 weeks for routine postop care.  -Take an 81 mg aspirin once per day by mouth for 6 weeks to prevent blood clots.

## 2019-03-20 NOTE — Plan of Care (Signed)
Patient alert and oriented, mae's well, voiding adequate amount of urine, swallowing without difficulty, no c/o pain at time of discharge. Patient discharged home with family. Script and discharged instructions given to patient. Patient and family stated understanding of instructions given. Patient has an appointment with Dr. Rogers   

## 2019-03-20 NOTE — Progress Notes (Signed)
   Subjective:  Patient reports pain as mild to moderate.  Doing well.  Had a fair night.  No complaints of shortness of breath, chest pain, nausea or vomiting.  She states that the block seems to be wearing off some.  Objective:   VITALS:   Vitals:   03/19/19 1918 03/19/19 2305 03/20/19 0337 03/20/19 0718  BP: 121/73 (!) 110/54 119/77 127/82  Pulse: 64 71 (!) 58 (!) 54  Resp: 18 18 18 18   Temp: 97.7 F (36.5 C) 98.4 F (36.9 C) 97.6 F (36.4 C) 97.7 F (36.5 C)  TempSrc: Oral Oral Oral Oral  SpO2: 95% 95% 97% 96%  Weight:        Neurovascular intact Intact pulses distally Dorsiflexion/Plantar flexion intact Incision: dressing C/D/I Compartment soft Intact light touch in the superficial peroneal nerve but not the deep peroneal nerve this morning.  This is likely secondary to the block.  Lab Results  Component Value Date   WBC 7.1 03/19/2019   HGB 14.4 03/19/2019   HCT 45.9 03/19/2019   MCV 107.5 (H) 03/19/2019   PLT 368 03/19/2019   BMET    Component Value Date/Time   NA 137 03/19/2019 0748   K 3.7 03/19/2019 0748   CL 99 03/19/2019 0748   CO2 27 03/19/2019 0748   GLUCOSE 88 03/19/2019 0748   BUN 6 03/19/2019 0748   CREATININE 0.61 03/19/2019 0748   CALCIUM 9.4 03/19/2019 0748   GFRNONAA >60 03/19/2019 0748   GFRAA >60 03/19/2019 0748     Assessment/Plan: 1 Day Post-Op   Active Problems:   Closed left ankle fracture   Advance diet Up with therapy -Elevate left lower extremity when able. -Nonweightbearing to left lower extremity.  Maintain splint clean and dry.  -She will work with physical therapy today and then discharge home post therapy.  -She will be on once daily aspirin for 6 weeks for DVT prophylaxis.  I will see her back in the office in 2 weeks.   [name] 03/20/2019, 7:33 AM   Geralynn Rile, MD [phone]

## 2019-03-20 NOTE — Evaluation (Signed)
Physical Therapy Evaluation Patient Details Name: Mallory Neal MRN: [id number] DOB: 01-Jan-1971 Today's Date: 03/20/2019   History of Present Illness  Patient is a 48 y/o female who presents s/p ORIF left ankle 03/19/19. PMH includes heart murmur, chest pain.  Clinical Impression  Patient presents with pain and post surgical deficits s/p above surgery. Pt independent prior to fall but has been NWB and using crutches since fall last week of July. Reports difficulty with crutches so switched to her friend's w/c. Today, pt requires Min A for transfers and gait training with use of RW. Able to maintain NWB during mobility but requires rest breaks due to fatigue. Education re: elevation, exercise, using RW vs w/c etc. Pt has support from friends at home. Will follow acutely to maximize independence and mobility prior to return home.    Follow Up Recommendations No PT follow up;Supervision for mobility/OOB    Equipment Recommendations  Rolling walker with 5" wheels    Recommendations for Other Services       Precautions / Restrictions Precautions Precautions: Fall Restrictions Weight Bearing Restrictions: Yes LLE Weight Bearing: Non weight bearing      Mobility  Bed Mobility Overal bed mobility: Modified Independent                Transfers Overall transfer level: Needs assistance Equipment used: Rolling walker (2 wheeled) Transfers: Sit to/from Stand Sit to Stand: Min guard         General transfer comment: Min guard for safety. Stood from EOB with use of momentum, cues for hand placement.  Ambulation/Gait Ambulation/Gait assistance: Min guard Gait Distance (Feet): 50 Feet Assistive device: Rolling walker (2 wheeled) Gait Pattern/deviations: Step-to pattern;Trunk flexed("hop to") Gait velocity: decreased   General Gait Details: "Hop to" technique with RW; cues to decrease step length for safety. 1 standing rest break.  Stairs            Wheelchair Mobility     Modified Rankin (Stroke Patients Only)       Balance Overall balance assessment: Needs assistance Sitting-balance support: Feet supported;No upper extremity supported Sitting balance-Leahy Scale: Good Sitting balance - Comments: Able to reach outside BoS and adjust sock without difficulty.   Standing balance support: During functional activity Standing balance-Leahy Scale: Poor Standing balance comment: Requires UE support in standing due to NWB status.                             Pertinent Vitals/Pain Pain Assessment: Faces Faces Pain Scale: Hurts little more Pain Location: LLE Pain Descriptors / Indicators: Operative site guarding;Throbbing;Sore Pain Intervention(s): Repositioned;Monitored during session    Home Living Family/patient expects to be discharged to:: Private residence Living Arrangements: Non-relatives/Friends Available Help at Discharge: Friend(s);Available 24 hours/day Type of Home: House Home Access: Ramped entrance     Home Layout: One level Home Equipment: Wheelchair - manual      Prior Function Level of Independence: Independent with assistive device(s)         Comments: Prior to fall, was independent with mobility, helping build a deck, works as Scientist, water quality. SInce fall end of July, pt been using crutches with difficulty and then w/c.     Hand Dominance   Dominant Hand: Left    Extremity/Trunk Assessment   Upper Extremity Assessment Upper Extremity Assessment: Defer to OT evaluation    Lower Extremity Assessment Lower Extremity Assessment: LLE deficits/detail LLE Deficits / Details: Able to wiggle toes but reports numbness  still. Block wearing off per report. LLE Sensation: decreased light touch    Cervical / Trunk Assessment Cervical / Trunk Assessment: Normal  Communication   Communication: No difficulties  Cognition Arousal/Alertness: Awake/alert Behavior During Therapy: WFL for tasks assessed/performed Overall  Cognitive Status: Within Functional Limits for tasks assessed                                        General Comments General comments (skin integrity, edema, etc.): Reports cast is slightly too high posteriorly on LLE and requesting it be redone prior to d/c. RN aware.    Exercises     Assessment/Plan    PT Assessment Patient needs continued PT services  PT Problem List Decreased strength;Decreased mobility;Pain;Impaired sensation;Decreased balance;Decreased knowledge of use of DME;Decreased activity tolerance       PT Treatment Interventions Therapeutic activities;Gait training;Patient/family education;Therapeutic exercise;Balance training;Functional mobility training;Wheelchair mobility training;DME instruction    PT Goals (Current goals can be found in the Care Plan section)  Acute Rehab PT Goals Patient Stated Goal: to get back to work ASAP PT Goal Formulation: With patient Time For Goal Achievement: 04/03/19 Potential to Achieve Goals: Good    Frequency Min 3X/week   Barriers to discharge        Co-evaluation               AM-PAC PT "6 Clicks" Mobility  Outcome Measure Help needed turning from your back to your side while in a flat bed without using bedrails?: None Help needed moving from lying on your back to sitting on the side of a flat bed without using bedrails?: None Help needed moving to and from a bed to a chair (including a wheelchair)?: A Little Help needed standing up from a chair using your arms (e.g., wheelchair or bedside chair)?: A Little Help needed to walk in hospital room?: A Little Help needed climbing 3-5 steps with a railing? : A Little 6 Click Score: 20    End of Session Equipment Utilized During Treatment: Gait belt Activity Tolerance: Patient tolerated treatment well Patient left: in bed;with call bell/phone within reach Nurse Communication: Mobility status PT Visit Diagnosis: Pain;Unsteadiness on feet  (R26.81);Muscle weakness (generalized) (M62.81) Pain - Right/Left: Left Pain - part of body: Leg    Time: 6568-1275 PT Time Calculation (min) (ACUTE ONLY): 16 min   Charges:   PT Evaluation $PT Eval Moderate Complexity: 1 Mod          Wray Kearns, PT, DPT Acute Rehabilitation Services Pager [phone] Office [place] 03/20/2019, 8:37 AM

## 2019-03-23 ENCOUNTER — Encounter (HOSPITAL_COMMUNITY): Payer: Self-pay | Admitting: Orthopedic Surgery

## 2019-03-25 NOTE — Discharge Summary (Signed)
Patient ID: Mallory Neal MRN: [id number] DOB/AGE: 12-21-1970 48 y.o.  Admit date: 03/19/2019 Discharge date: 03/20/2019  Primary Diagnosis: left ankle  fracture  Admission Diagnoses:  Past Medical History:  Diagnosis Date   Anemia    Arthritis    Chest pain    Heart murmur    Discharge Diagnoses:   Active Problems:   Closed left ankle fracture  Estimated body mass index is 30.99 kg/m as calculated from the following:   Height as of 02/24/19: 5\' 6"  (1.676 m).   Weight as of this encounter: 87.1 kg.  Procedure:  Procedure(s) (LRB): OPEN REDUCTION INTERNAL FIXATION (ORIF) ANKLE WITH SYNDESMOSIS FIXATION (Left)   Consults: None  HPI: admitted to [facility] for operative management of an unstable left ankle fracture and associated postoperative care. Laboratory Data: Admission on 03/19/2019, Discharged on 03/20/2019  Component Date Value Ref Range Status   Sodium 03/19/2019 137  135 - 145 mmol/L Final   Potassium 03/19/2019 3.7  3.5 - 5.1 mmol/L Final   Chloride 03/19/2019 99  98 - 111 mmol/L Final   CO2 03/19/2019 27  22 - 32 mmol/L Final   Glucose, Bld 03/19/2019 88  70 - 99 mg/dL Final   BUN 03/19/2019 6  6 - 20 mg/dL Final   Creatinine, Ser 03/19/2019 0.61  0.44 - 1.00 mg/dL Final   Calcium 03/19/2019 9.4  8.9 - 10.3 mg/dL Final   GFR calc non Af Amer 03/19/2019 >60  >60 mL/min Final   GFR calc Af Amer 03/19/2019 >60  >60 mL/min Final   Anion gap 03/19/2019 11  5 - 15 Final   Performed at [facility], [place] [address] [place], [place] [address]   WBC 03/19/2019 7.1  4.0 - 10.5 K/uL Final   RBC 03/19/2019 4.27  3.87 - 5.11 MIL/uL Final   Hemoglobin 03/19/2019 14.4  12.0 - 15.0 g/dL Final   HCT 03/19/2019 45.9  36.0 - 46.0 % Final   MCV 03/19/2019 107.5* 80.0 - 100.0 fL Final   MCH 03/19/2019 33.7  26.0 - 34.0 pg Final   MCHC 03/19/2019 31.4  30.0 - 36.0 g/dL Final   RDW 03/19/2019 13.5  11.5 - 15.5 % Final   Platelets 03/19/2019  368  150 - 400 K/uL Final   nRBC 03/19/2019 0.0  0.0 - 0.2 % Final   Performed at [facility] [address], [place], [place] [address]   HIV Screen 4th Generation wRfx 03/19/2019 Non Reactive  Non Reactive Final   Comment: (NOTE) Performed At: [facility] [place], [place] [id number] Rush Farmer MD TD:[id number]   Hospital Outpatient Visit on 03/16/2019  Component Date Value Ref Range Status   SARS Coronavirus 2 03/16/2019 NEGATIVE  NEGATIVE Final   Comment: (NOTE) SARS-CoV-2 target nucleic acids are NOT DETECTED. The SARS-CoV-2 RNA is generally detectable in upper and lower respiratory specimens during the acute phase of infection. Negative results do not preclude SARS-CoV-2 infection, do not rule out co-infections with other pathogens, and should not be used as the sole basis for treatment or other patient management decisions. Negative results must be combined with clinical observations, patient history, and epidemiological information. The expected result is Negative. Fact Sheet for Patients: SugarRoll.be Fact Sheet for Healthcare Providers: https://www.woods-mathews.com/ This test is not yet approved or cleared by the [place] FDA and  has been authorized for detection and/or diagnosis of SARS-CoV-2 by FDA under an Emergency Use Authorization (EUA). This EUA will  remain  in effect (meaning this test can be used) for the duration of the COVID-19 declaration under Section 56                          4(b)(1) of the Act, 21 U.S.C. section 360bbb-3(b)(1), unless the authorization is terminated or revoked sooner. Performed at [facility], [place] [address], [place], [place] [address]   Admission on 02/24/2019, Discharged on 02/24/2019  Component Date Value Ref Range Status   Sodium 02/24/2019 137  135 - 145 mmol/L Final   Potassium 02/24/2019 3.8  3.5 - 5.1 mmol/L Final   Chloride  02/24/2019 100  98 - 111 mmol/L Final   CO2 02/24/2019 28  22 - 32 mmol/L Final   Glucose, Bld 02/24/2019 92  70 - 99 mg/dL Final   BUN 02/24/2019 9  6 - 20 mg/dL Final   Creatinine, Ser 02/24/2019 0.58  0.44 - 1.00 mg/dL Final   Calcium 02/24/2019 8.9  8.9 - 10.3 mg/dL Final   GFR calc non Af Amer 02/24/2019 >60  >60 mL/min Final   GFR calc Af Amer 02/24/2019 >60  >60 mL/min Final   Anion gap 02/24/2019 9  5 - 15 Final   Performed at [facility], [address], [place], [place] [address]   Troponin I (High Sensitivity) 02/24/2019 <2.0  <18 ng/L Final   Performed at [facility], [address] [place], [place] [address]   WBC 02/24/2019 9.1  4.0 - 10.5 K/uL Final   RBC 02/24/2019 3.69* 3.87 - 5.11 MIL/uL Final   Hemoglobin 02/24/2019 12.5  12.0 - 15.0 g/dL Final   HCT 02/24/2019 38.4  36.0 - 46.0 % Final   MCV 02/24/2019 104.1* 80.0 - 100.0 fL Final   MCH 02/24/2019 33.9  26.0 - 34.0 pg Final   MCHC 02/24/2019 32.6  30.0 - 36.0 g/dL Final   RDW 02/24/2019 13.3  11.5 - 15.5 % Final   Platelets 02/24/2019 252  150 - 400 K/uL Final   nRBC 02/24/2019 0.0  0.0 - 0.2 % Final   Neutrophils Relative % 02/24/2019 68  % Final   Neutro Abs 02/24/2019 6.2  1.7 - 7.7 K/uL Final   Lymphocytes Relative 02/24/2019 16  % Final   Lymphs Abs 02/24/2019 1.5  0.7 - 4.0 K/uL Final   Monocytes Relative 02/24/2019 9  % Final   Monocytes Absolute 02/24/2019 0.8  0.1 - 1.0 K/uL Final   Eosinophils Relative 02/24/2019 7  % Final   Eosinophils Absolute 02/24/2019 0.6* 0.0 - 0.5 K/uL Final   Basophils Relative 02/24/2019 0  % Final   Basophils Absolute 02/24/2019 0.0  0.0 - 0.1 K/uL Final   Immature Granulocytes 02/24/2019 0  % Final   Abs Immature Granulocytes 02/24/2019 0.02  0.00 - 0.07 K/uL Final   Performed at [facility] [address], [place], [place] [address]   D-Dimer, Quant 02/24/2019 <0.27  0.00 - 0.50 ug/mL-FEU Final   Comment: (NOTE) At the manufacturer  cut-off of 0.50 ug/mL FEU, this assay has been documented to exclude PE with a sensitivity and negative predictive value of 97 to 99%.  At this time, this assay has not been approved by the FDA to exclude DVT/VTE. Results should be correlated with clinical presentation. Performed at [facility], [address], [place], [place] [address]    Troponin I (High Sensitivity) 02/24/2019 <2.0  <18 ng/L Final   Performed at [facility],  [address] [place], [place] [address]     X-Rays:Dg Ankle 2 Views Left  Result Date: 03/11/2019 CLINICAL DATA:  Post reduction EXAM: LEFT ANKLE - 2 VIEW COMPARISON:  03/10/2019 FINDINGS: Acute distal fibular fracture with residual 1/4 bone with lateral and posterior displacement. Persistent lateral displacement of the talar dome with respect to the distal tibia. Osteophytic fragments adjacent to the medial malleolus. Plantar calcaneal spur. Slightly widened anterior joint space. IMPRESSION: 1. Residual lateral displacement of the talar dome with respect to the distal tibia 2. Acute distal fibular fracture with residual lateral and posterior displacement 3. Multiple ossific fragments adjacent to the medial malleolus Electronically Signed   By: Donavan Foil M.D.   On: 03/11/2019 03:40   Dg Ankle 2 Views Left  Result Date: 03/11/2019 CLINICAL DATA:  Post reduction left ankle EXAM: LEFT ANKLE - 2 VIEW COMPARISON:  03/10/2019 FINDINGS: Distal fibular fracture with approximately 1/2 shaft width posterolateral displacement, unchanged. Lateral talar shift with widening of the medial ankle mortise, unchanged. Overlying cast/splint obscures osseous detail. Associated soft tissue swelling. IMPRESSION: Displaced distal fibular fracture.  Stable lateral talar shift. Electronically Signed   By: Julian Hy M.D.   On: 03/11/2019 00:27   Dg Ankle Complete Left  Result Date: 03/19/2019 FLUOROSCOPY TIME:  25 seconds. Images: 5 EXAM: DG C-ARM 61-120 MIN COMPARISON:  None.  FINDINGS: Findings of left ankle ORIF for the left distal fibular fracture. Hardware is in good position. No other acute abnormalities. IMPRESSION: ORIF of left ankle fracture. Hardware is in good position. Electronically Signed   By: Dorise Bullion III M.D   On: 03/19/2019 13:17   Dg Ankle Complete Left  Result Date: 03/10/2019 CLINICAL DATA:  Left ankle pain after a fall EXAM: LEFT ANKLE COMPLETE - 3+ VIEW COMPARISON:  None. FINDINGS: There is obliquely oriented fracture seen through the distal fibula at the level of the ankle mortise. There is widening of the medial clear space which measures 9 mm. There is also give rounded ossific density seen adjacent to the medial talus which could represent fracture from the talar body or medial malleolar avulsion fracture. There is significant surrounding soft tissue swelling seen around the ankle. Ankle joint effusion is present. Calcaneal enthesopathy is seen. IMPRESSION: 1. Weber B type ankle fracture with widening of the medial clear space 2. Ossific density seen adjacent to the medial malleolus which could represent avulsion fracture or talar body fracture. Electronically Signed   By: Prudencio Pair M.D.   On: 03/10/2019 22:20   Ct Head Wo Contrast  Result Date: 03/11/2019 CLINICAL DATA:  Left ankle pain after falling. Altered mental status. EXAM: CT HEAD WITHOUT CONTRAST TECHNIQUE: Contiguous axial images were obtained from the base of the skull through the vertex without intravenous contrast. COMPARISON:  None. FINDINGS: Brain: No acute infarct, hemorrhage, or mass lesion is present. No significant white matter lesions are present. The ventricles are of normal size. The brainstem and cerebellum are within normal limits. Vascular: No hyperdense vessel or unexpected calcification. Skull: Calvarium is intact. No focal lytic or blastic lesions are present. Soft tissue swelling in the high left parietal scalp may represent recent trauma. There is no underlying  fracture. Sinuses/Orbits: Mild mucosal thickening is scattered in the ethmoid air cells. No fluid levels are present. The globes and orbits are within normal limits. IMPRESSION: 1. Normal CT appearance of the brain. 2. Soft tissue swelling over the left parietal scalp. Question acute injury. There is no underlying fracture. 3. Minimal sinus  disease. Electronically Signed   By: San Morelle M.D.   On: 03/11/2019 05:28   Dg Chest Portable 1 View  Result Date: 02/24/2019 CLINICAL DATA:  Chest pain, cough and chills for 3 days. EXAM: PORTABLE CHEST 1 VIEW COMPARISON:  Chest x-rays dated 08/16/2015 in 03/12/2010. FINDINGS: Heart size is upper normal. Lungs are clear. No pleural effusion or pneumothorax seen. Osseous structures about the chest are unremarkable. IMPRESSION: No active disease. No evidence of pneumonia or pulmonary edema. Electronically Signed   By: Franki Cabot M.D.   On: 02/24/2019 14:59   Dg C-arm 1-60 Min  Result Date: 03/19/2019 CLINICAL DATA:  ORIF of left ankle. FLUOROSCOPY TIME:  25 seconds. Images: 5 EXAM: DG C-ARM 61-120 MIN COMPARISON:  None. FINDINGS: Findings of left ankle ORIF for the left distal fibular fracture. Hardware is in good position. No other acute abnormalities. IMPRESSION: ORIF of left ankle fracture.  Hardware is in good position. Electronically Signed   By: Dorise Bullion III M.D   On: 03/19/2019 13:16    EKG: Orders placed or performed during the hospital encounter of 02/24/19   ED EKG   ED EKG   EKG 12-Lead   EKG 12-Lead   EKG     Hospital Course: Mallory Neal is a 48 y.o. who was admitted to Hospital. They were brought to the operating room on 03/19/2019 and underwent Procedure(s): OPEN REDUCTION INTERNAL FIXATION (ORIF) ANKLE WITH SYNDESMOSIS FIXATION.  Patient tolerated the procedure well and was later transferred to the recovery room and then to the orthopaedic floor for postoperative care.  They were given PO and IV analgesics for pain  control following their surgery.  They were given 24 hours of postoperative antibiotics of  Anti-infectives (From admission, onward)   Start     Dose/Rate Route Frequency Ordered Stop   03/19/19 0800  ceFAZolin (ANCEF) IVPB 2g/100 mL premix     2 g 200 mL/hr over 30 Minutes Intravenous On call to O.R. 03/19/19 0748 03/19/19 1048   03/19/19 0750  ceFAZolin (ANCEF) 2-4 GM/100ML-% IVPB    Note to Pharmacy: Lorne Skeens   : cabinet override      03/19/19 0750 03/19/19 1033     and started on DVT prophylaxis in the form of Aspirin.   PT and OT were ordered.  Patient had a good night on the evening of surgery.  Patient was seen in rounds and was ready to go home.   Diet: Diabetic diet Activity:NWB Follow-up:in 2 weeks Disposition - Home Discharged Condition: good   Discharge Instructions    Call MD / Call 911   Complete by: As directed    If you experience chest pain or shortness of breath, CALL 911 and be transported to the hospital emergency room.  If you develope a fever above 101 F, pus (white drainage) or increased drainage or redness at the wound, or calf pain, call your surgeon's office.   Constipation Prevention   Complete by: As directed    Drink plenty of fluids.  Prune juice may be helpful.  You may use a stool softener, such as Colace (over the counter) 100 mg twice a day.  Use MiraLax (over the counter) for constipation as needed.   Diet - low sodium heart healthy   Complete by: As directed    Increase activity slowly as tolerated   Complete by: As directed      Allergies as of 03/20/2019      Reactions   Codeine  Hives, Shortness Of Breath, Rash   Tolerates hydrocodone       Medication List    TAKE these medications   HYDROcodone-acetaminophen 5-325 MG tablet Commonly known as: NORCO/VICODIN Take 1 tablet by mouth every 4 (four) hours as needed. What changed:   when to take this  reasons to take this   methocarbamol 500 MG tablet Commonly known as:  ROBAXIN Take 2 tablets (1,000 mg total) by mouth 4 (four) times daily as needed for muscle spasms (muscle spasm/pain). What changed: how much to take   naproxen 250 MG tablet Commonly known as: NAPROSYN Take 1 tablet (250 mg total) by mouth 2 (two) times daily as needed for mild pain or moderate pain (take with food).   ondansetron 4 MG disintegrating tablet Commonly known as: Zofran ODT Take 1 tablet (4 mg total) by mouth every 8 (eight) hours as needed.   oxyCODONE 5 MG immediate release tablet Commonly known as: Roxicodone Take 1 tablet (5 mg total) by mouth every 4 (four) hours as needed for severe pain.      Follow-up Information    [name], MD In 2 weeks.   Specialty: Orthopedic Surgery Why: For suture removal, For wound re-check Contact information: [address] [place] [address] [phone]           Signed: Geralynn Rile, MD Orthopaedic Surgery 03/25/2019, 9:29 PM

## 2020-09-08 ENCOUNTER — Other Ambulatory Visit: Payer: Self-pay

## 2020-09-08 ENCOUNTER — Emergency Department (HOSPITAL_COMMUNITY)
Admission: EM | Admit: 2020-09-08 | Discharge: 2020-09-08 | Disposition: A | Discharge status: Home / Self Care | Payer: Medicaid Other | Attending: Emergency Medicine | Admitting: Emergency Medicine

## 2020-09-08 ENCOUNTER — Encounter (HOSPITAL_COMMUNITY): Payer: Self-pay

## 2020-09-08 DIAGNOSIS — M5442 Lumbago with sciatica, left side: Secondary | ICD-10-CM

## 2020-09-08 DIAGNOSIS — R200 Anesthesia of skin: Secondary | ICD-10-CM | POA: Insufficient documentation

## 2020-09-08 MED ORDER — LIDOCAINE 5 % EX PTCH
1.0000 | MEDICATED_PATCH | CUTANEOUS | Status: DC
  Administered 2020-09-08: 1 via TRANSDERMAL
  Filled 2020-09-08: qty 1

## 2020-09-08 MED ORDER — NAPROXEN 500 MG PO TABS: 500.0000 mg | ORAL_TABLET | Freq: Two times a day (BID) | ORAL | 0 refills | Status: DC

## 2020-09-08 MED ORDER — PREDNISONE 20 MG PO TABS
20.0000 mg | ORAL_TABLET | Freq: Once | ORAL | Status: AC
  Administered 2020-09-08: 20 mg via ORAL
  Filled 2020-09-08: qty 1

## 2020-09-08 MED ORDER — METHOCARBAMOL 500 MG PO TABS: 500.0000 mg | ORAL_TABLET | Freq: Two times a day (BID) | ORAL | 0 refills | Status: DC

## 2020-09-08 MED ORDER — PREDNISONE 20 MG PO TABS: 20.0000 mg | ORAL_TABLET | Freq: Every day | ORAL | 0 refills | Status: AC

## 2020-09-08 MED ORDER — METHOCARBAMOL 500 MG PO TABS
500.0000 mg | ORAL_TABLET | Freq: Once | ORAL | Status: AC
  Administered 2020-09-08: 500 mg via ORAL
  Filled 2020-09-08: qty 1

## 2020-09-08 MED ORDER — KETOROLAC TROMETHAMINE 30 MG/ML IJ SOLN
30.0000 mg | Freq: Once | INTRAMUSCULAR | Status: AC
  Administered 2020-09-08: 30 mg via INTRAMUSCULAR
  Filled 2020-09-08: qty 1

## 2020-09-08 NOTE — Discharge Instructions (Addendum)
I am sending you home with pain medication, a muscle relaxer, and steroids.  Take as prescribed.  Muscle relaxer can cause drowsiness do not drive or operate machinery while on the medication.  You may also purchase over-the-counter Lidoderm patches and Voltaren gel for added pain relief.  I have included low back exercises.  Perform daily.  Please follow-up with PCP if symptoms not improve over the next week.  Return to the ER for new or worsening symptoms.

## 2020-09-08 NOTE — ED Triage Notes (Signed)
Pt c/o pain in left lower back radiating down left leg.  Denies urinary symptoms.  Denies injury, denies any bowel or bladder incontinence.

## 2020-09-08 NOTE — ED Provider Notes (Signed)
[facility] Provider Note   CSN: [id number] Arrival date & time: 09/08/20  1121     History Chief Complaint  Patient presents with  . Back Pain    Mallory Neal is a 50 y.o. female with no significant past medical history who presents to the ED due to left-sided low back pain that radiates down the posterior aspect of her left leg associated with numbness/tingling.  Patient denies direct injury to low back.  No change in recent activities.  Denies saddle paresthesias, bowel/bladder incontinence, lower extremity weakness, IV drug use, fever/chills, and history of cancer.  Pain is worse with movement especially ambulation.  Pain is relieved by rocking back and forth.  No treatment prior to arrival.  Denies associated abdominal pain, urinary symptoms, and vaginal symptoms. Denies rash.  History obtained from patient and past medical records. No interpreter used during encounter.      Past Medical History:  Diagnosis Date  . Anemia   . Arthritis   . Chest pain   . Heart murmur     Patient Active Problem List   Diagnosis Date Noted  . Closed left ankle fracture 03/19/2019  . S/P TAH-BSO (total abdominal hysterectomy and bilateral salpingo-oophorectomy) 12/05/2016  . Abdominal pain, LLQ 11/07/2016  . Chest pain   . Heart murmur     Past Surgical History:  Procedure Laterality Date  . ABDOMINAL HYSTERECTOMY N/A 12/05/2016   Procedure: HYSTERECTOMY ABDOMINAL;  Surgeon: Florian Buff, MD;  Location: [facility];  Service: Gynecology;  Laterality: N/A;  . CHOLECYSTECTOMY    . ORIF ANKLE FRACTURE Left 03/19/2019   Procedure: OPEN REDUCTION INTERNAL FIXATION (ORIF) ANKLE WITH SYNDESMOSIS FIXATION;  Surgeon: [name], MD;  Location: [facility];  Service: Orthopedics;  Laterality: Left;  90 MINS  . SALPINGOOPHORECTOMY Bilateral 12/05/2016   Procedure: SALPINGO OOPHORECTOMY;  Surgeon: Florian Buff, MD;  Location: [facility];  Service: Gynecology;  Laterality: Bilateral;      OB History    Gravida  2   Para  2   Term      Preterm      AB      Living        SAB      IAB      Ectopic      Multiple      Live Births              Family History  Problem Relation Age of Onset  . Diabetes Paternal Grandfather   . Heart disease Paternal Grandmother   . Heart disease Maternal Grandmother   . Cancer Father        lung    Social History   Tobacco Use  . Smoking status: Never Smoker  . Smokeless tobacco: Current User    Types: Snuff  Substance Use Topics  . Alcohol use: Yes    Comment: occ  . Drug use: Yes    Types: Marijuana    Comment: 1-2x a week    Home Medications Prior to Admission medications   Medication Sig Start Date End Date Taking? Authorizing Provider  methocarbamol (ROBAXIN) 500 MG tablet Take 1 tablet (500 mg total) by mouth 2 (two) times daily. 09/08/20  Yes Dalessandro Baldyga C, PA-C  naproxen (NAPROSYN) 500 MG tablet Take 1 tablet (500 mg total) by mouth 2 (two) times daily. 09/08/20  Yes Denice Cardon, Druscilla Brownie, PA-C  predniSONE (DELTASONE) 20 MG tablet Take 1 tablet (20 mg total) by mouth  daily for 5 days. 09/08/20 09/13/20 Yes Rollins Wrightson, Druscilla Brownie, PA-C  HYDROcodone-acetaminophen (NORCO/VICODIN) 5-325 MG tablet Take 1 tablet by mouth every 4 (four) hours as needed. Patient taking differently: Take 1 tablet by mouth every 6 (six) hours as needed for moderate pain.  03/11/19   Rancour, Annie Main, MD  methocarbamol (ROBAXIN) 500 MG tablet Take 2 tablets (1,000 mg total) by mouth 4 (four) times daily as needed for muscle spasms (muscle spasm/pain). Patient taking differently: Take 500 mg by mouth 4 (four) times daily as needed for muscle spasms (muscle spasm/pain).  02/24/19   Francine Graven, DO  naproxen (NAPROSYN) 250 MG tablet Take 1 tablet (250 mg total) by mouth 2 (two) times daily as needed for mild pain or moderate pain (take with food). Patient not taking: Reported on 03/17/2019 02/24/19   Francine Graven, DO   ondansetron (ZOFRAN ODT) 4 MG disintegrating tablet Take 1 tablet (4 mg total) by mouth every 8 (eight) hours as needed. 03/20/19   [name], MD    Allergies    Codeine  Review of Systems   Review of Systems  Constitutional: Negative for chills and fever.  Gastrointestinal: Negative for abdominal pain.  Genitourinary: Negative for dysuria.  Musculoskeletal: Positive for back pain.  Neurological: Positive for numbness.  All other systems reviewed and are negative.   Physical Exam Updated Vital Signs BP 129/87 (BP Location: Left Arm)   Pulse 75   Temp 98 F (36.7 C) (Oral)   Resp 20   Ht 5\' 6"  (1.676 m)   Wt 86.2 kg   LMP  (LMP Unknown) Comment: states she took a pill to stop periods  SpO2 98%   BMI 30.67 kg/m   Physical Exam Vitals and nursing note reviewed.  Constitutional:      General: She is not in acute distress.    Appearance: She is not ill-appearing.  HENT:     Head: Normocephalic.  Eyes:     Pupils: Pupils are equal, round, and reactive to light.  Neck:     Comments: No cervical midline tenderness. Cardiovascular:     Rate and Rhythm: Normal rate and regular rhythm.     Pulses: Normal pulses.     Heart sounds: Normal heart sounds. No murmur heard. No friction rub. No gallop.   Pulmonary:     Effort: Pulmonary effort is normal.     Breath sounds: Normal breath sounds.  Abdominal:     General: Abdomen is flat. Bowel sounds are normal. There is no distension.     Palpations: Abdomen is soft.     Tenderness: There is no abdominal tenderness. There is no guarding or rebound.     Comments: Abdomen soft, nondistended, nontender to palpation in all quadrants without guarding or peritoneal signs. No rebound.   Musculoskeletal:     Cervical back: Neck supple.     Comments: No T-spine and L-spine midline tenderness, no stepoff or deformity, reproducible left sided lumbar paraspinal tenderness No leg edema bilaterally Patient moves all extremities  without difficulty. DP/PT pulses 2+ and equal bilaterally Sensation grossly intact bilaterally Strength of knee flexion and extension is 5/5 Plantar and dorsiflexion of ankle 5/5 Able to ambulate without difficulty   Skin:    General: Skin is warm and dry.     Findings: No rash.  Neurological:     General: No focal deficit present.     Mental Status: She is alert.  Psychiatric:        Mood and  Affect: Mood normal.        Behavior: Behavior normal.     ED Results / Procedures / Treatments   Labs (all labs ordered are listed, but only abnormal results are displayed) Labs Reviewed - No data to display  EKG None  Radiology No results found.  Procedures Procedures   Medications Ordered in ED Medications  lidocaine (LIDODERM) 5 % 1 patch (1 patch Transdermal Patch Applied 09/08/20 1531)  ketorolac (TORADOL) 30 MG/ML injection 30 mg (30 mg Intramuscular Given 09/08/20 1528)  methocarbamol (ROBAXIN) tablet 500 mg (500 mg Oral Given 09/08/20 1528)  predniSONE (DELTASONE) tablet 20 mg (20 mg Oral Given 09/08/20 1528)    ED Course  I have reviewed the triage vital signs and the nursing notes.  Pertinent labs & imaging results that were available during my care of the patient were reviewed by me and considered in my medical decision making (see chart for details).    MDM Rules/Calculators/A&P                         50 year old female presents to the ED due to left-sided low back pain that radiates down the posterior aspect of the left leg.  Patient denies saddle paresthesias, bowel/bladder incontinence, lower extremity weakness, IV drug use, history of cancer, and fever/chills.  Vitals all within normal limits.  Physical exam significant for reproducible left-sided lumbar paraspinal tenderness.  No cervical, thoracic, or lumbar midline tenderness.  Bilateral lower extremities neurovascularly intact.  Patient able to ambulate in the room without difficulty.  No overlying rash to  suggest shingles. Patient given robaxin, toradol, lidoderm patch, and prednisone here in the ED.  Broad differential for back pain considered includes malignancy, disc herniation, spinal epidural abscess, spinal fracture, cauda equina, pyelonephritis, kidney stone, AAA, AD, pancreatitis, PE and PTX.   History without red flags (cancer, IVDU, weakness, saddle anesthesia, trauma, weight loss) and physical exam most consistent with lumbar radiculopathy. Doubt cauda equina or disc herniation due to lack of saddle anesthesia/bowel or bladder incontinence or urinary retention, normal gait and reassuring physical examination without neurologic deficits. History is not supportive of kidney stone, AAA, AD, pancreatitis, PE or PTX. Patient has no CVA tenderness or urinary symptoms to suggest pyelonephritis or kidney stone.   Will manage patient conservatively at this time. NSAIDs, steroids, back exercises/stretches, heat therapy and follow up with PCP if symptoms do not resolve in 3-4 weeks. Patient offered muscle relaxer for comfort at night. Counseled on need to return to ED for fever, worsening or concerning symptoms. Strict ED precautions discussed with patient. Patient states understanding and agrees to plan. Patient discharged home in no acute distress and stable vitals. Final Clinical Impression(s) / ED Diagnoses Final diagnoses:  Acute left-sided low back pain with left-sided sciatica    Rx / DC Orders ED Discharge Orders         Ordered    naproxen (NAPROSYN) 500 MG tablet  2 times daily        09/08/20 1516    methocarbamol (ROBAXIN) 500 MG tablet  2 times daily        09/08/20 1516    predniSONE (DELTASONE) 20 MG tablet  Daily        09/08/20 1516           Karie Kirks 09/08/20 1549    Sherwood Gambler, MD 09/09/20 1442

## 2020-09-24 ENCOUNTER — Encounter (HOSPITAL_COMMUNITY): Payer: Self-pay | Admitting: Emergency Medicine

## 2020-09-24 ENCOUNTER — Emergency Department (HOSPITAL_COMMUNITY)
Admission: EM | Admit: 2020-09-24 | Discharge: 2020-09-24 | Disposition: A | Discharge status: Home / Self Care | Payer: Self-pay | Attending: Emergency Medicine | Admitting: Emergency Medicine

## 2020-09-24 ENCOUNTER — Emergency Department (HOSPITAL_COMMUNITY): Payer: Self-pay

## 2020-09-24 ENCOUNTER — Other Ambulatory Visit: Payer: Self-pay

## 2020-09-24 DIAGNOSIS — F17290 Nicotine dependence, other tobacco product, uncomplicated: Secondary | ICD-10-CM | POA: Insufficient documentation

## 2020-09-24 DIAGNOSIS — M779 Enthesopathy, unspecified: Secondary | ICD-10-CM

## 2020-09-24 DIAGNOSIS — M25572 Pain in left ankle and joints of left foot: Secondary | ICD-10-CM

## 2020-09-24 DIAGNOSIS — R202 Paresthesia of skin: Secondary | ICD-10-CM | POA: Insufficient documentation

## 2020-09-24 DIAGNOSIS — M25772 Osteophyte, left ankle: Secondary | ICD-10-CM | POA: Insufficient documentation

## 2020-09-24 NOTE — Discharge Instructions (Signed)
Please follow-up with your orthopedic doctor.  I see that you had a follow-up appointment at some point with Dr. Victorino December.  He is with [facility].  Please call to make appointment with them.  Your x-ray shows no fracture but does show a bone spur which may be causing her symptoms.  I recommend rest, ice, elevation and gentle stretching.  Please use Tylenol or ibuprofen for pain.  You may use 600 mg ibuprofen every 6 hours or 1000 mg of Tylenol every 6 hours.  You may choose to alternate between the 2.  This would be most effective.  Not to exceed 4 g of Tylenol within 24 hours.  Not to exceed 3200 mg ibuprofen 24 hours.

## 2020-09-24 NOTE — ED Provider Notes (Signed)
[facility] Provider Note   CSN: [id number] Arrival date & time: 09/24/20  1956     History Chief Complaint  Patient presents with  . Ankle Pain    Mallory Neal is a 50 y.o. female.  HPI  Patient is 50 year old female with past medical history significant for left fibular fracture which was repaired approximately 1 year ago.  She states that she was seen in [facility] for this.  She states that over the past week she has developed left ankle pain that she states is worse when she is walking or going from sitting to standing.  She states she occasionally hears a popping sound.  She was concerned that there is something loose with her plates and screws.  She states that she does occasionally have paresthesias in her left foot.  She denies frank numbness.  She has no difficulty walking although it has become more painful.  She denies any other joint pain such as knee or hip.  She denies any trauma or new injury.  She states that her symptoms have come on gradually worsened over the past week.  No other associated symptoms.  She has tried Aleve and ibuprofen with only mild relief.     Past Medical History:  Diagnosis Date  . Anemia   . Arthritis   . Chest pain   . Heart murmur     Patient Active Problem List   Diagnosis Date Noted  . Closed left ankle fracture 03/19/2019  . S/P TAH-BSO (total abdominal hysterectomy and bilateral salpingo-oophorectomy) 12/05/2016  . Abdominal pain, LLQ 11/07/2016  . Chest pain   . Heart murmur     Past Surgical History:  Procedure Laterality Date  . ABDOMINAL HYSTERECTOMY N/A 12/05/2016   Procedure: HYSTERECTOMY ABDOMINAL;  Surgeon: Florian Buff, MD;  Location: [facility];  Service: Gynecology;  Laterality: N/A;  . CHOLECYSTECTOMY    . ORIF ANKLE FRACTURE Left 03/19/2019   Procedure: OPEN REDUCTION INTERNAL FIXATION (ORIF) ANKLE WITH SYNDESMOSIS FIXATION;  Surgeon: [name], MD;  Location: [facility];   Service: Orthopedics;  Laterality: Left;  90 MINS  . SALPINGOOPHORECTOMY Bilateral 12/05/2016   Procedure: SALPINGO OOPHORECTOMY;  Surgeon: Florian Buff, MD;  Location: [facility];  Service: Gynecology;  Laterality: Bilateral;     OB History    Gravida  2   Para  2   Term      Preterm      AB      Living        SAB      IAB      Ectopic      Multiple      Live Births              Family History  Problem Relation Age of Onset  . Diabetes Paternal Grandfather   . Heart disease Paternal Grandmother   . Heart disease Maternal Grandmother   . Cancer Father        lung    Social History   Tobacco Use  . Smoking status: Never Smoker  . Smokeless tobacco: Current User    Types: Snuff  Substance Use Topics  . Alcohol use: Yes    Alcohol/week: 4.0 standard drinks    Types: 4 Cans of beer per week    Comment: occ  . Drug use: Yes    Types: Marijuana    Comment: last use x 1 week ago    Home Medications Prior to  Admission medications   Medication Sig Start Date End Date Taking? Authorizing Provider  HYDROcodone-acetaminophen (NORCO/VICODIN) 5-325 MG tablet Take 1 tablet by mouth every 4 (four) hours as needed. Patient taking differently: Take 1 tablet by mouth every 6 (six) hours as needed for moderate pain.  03/11/19   Rancour, Annie Main, MD  methocarbamol (ROBAXIN) 500 MG tablet Take 2 tablets (1,000 mg total) by mouth 4 (four) times daily as needed for muscle spasms (muscle spasm/pain). Patient taking differently: Take 500 mg by mouth 4 (four) times daily as needed for muscle spasms (muscle spasm/pain).  02/24/19   Francine Graven, DO  methocarbamol (ROBAXIN) 500 MG tablet Take 1 tablet (500 mg total) by mouth 2 (two) times daily. 09/08/20   Suzy Bouchard, PA-C  naproxen (NAPROSYN) 250 MG tablet Take 1 tablet (250 mg total) by mouth 2 (two) times daily as needed for mild pain or moderate pain (take with food). Patient not taking: Reported on 03/17/2019 02/24/19    Francine Graven, DO  naproxen (NAPROSYN) 500 MG tablet Take 1 tablet (500 mg total) by mouth 2 (two) times daily. 09/08/20   Suzy Bouchard, PA-C  ondansetron (ZOFRAN ODT) 4 MG disintegrating tablet Take 1 tablet (4 mg total) by mouth every 8 (eight) hours as needed. 03/20/19   [name], MD    Allergies    Codeine  Review of Systems   Review of Systems  Constitutional: Negative for fever.  HENT: Negative for congestion.   Respiratory: Negative for shortness of breath.   Cardiovascular: Negative for chest pain.  Gastrointestinal: Negative for abdominal distention.  Musculoskeletal:       Left ankle pain  Neurological: Negative for dizziness and headaches.    Physical Exam Updated Vital Signs BP 138/89   Pulse 88   Temp 98.4 F (36.9 C)   Resp 18   Ht 5\' 6"  (1.676 m)   Wt 86.2 kg   LMP  (LMP Unknown) Comment: states she took a pill to stop periods  SpO2 94%   BMI 30.67 kg/m   Physical Exam Vitals and nursing note reviewed.  Constitutional:      General: She is not in acute distress.    Appearance: Normal appearance. She is not ill-appearing.  HENT:     Head: Normocephalic and atraumatic.     Mouth/Throat:     Mouth: Mucous membranes are moist.  Eyes:     General: No scleral icterus.       Right eye: No discharge.        Left eye: No discharge.     Conjunctiva/sclera: Conjunctivae normal.  Pulmonary:     Effort: Pulmonary effort is normal.     Breath sounds: No stridor.  Musculoskeletal:     Comments: Some tenderness palpation of the medial malleolus of the left ankle.  Forage motion of ankle and full range of motion of toes.  Distally neurovascularly intact with good cap refill and DP PT pulses 3+ and symmetric.  Tenderness to palpation of any right shin.  Neurological:     Mental Status: She is alert and oriented to person, place, and time. Mental status is at baseline.     ED Results / Procedures / Treatments   Labs (all labs ordered are  listed, but only abnormal results are displayed) Labs Reviewed - No data to display  EKG None  Radiology DG Ankle Complete Left  Result Date: 09/24/2020 CLINICAL DATA:  Left ankle pain. Constantly hurts. Goes numb after standing. History  of prior surgery. No new injury. EXAM: LEFT ANKLE COMPLETE - 3+ VIEW COMPARISON:  03/11/2019 and operative images dated 03/19/2019. FINDINGS: No acute fracture.  No bone lesion. Lateral fixation plate extends along the distal fibula with well-seated screws. A short fixation plate or button lies along the medial margin of the distal tibial metaphysis. Osseous bridging is noted across the interosseous membrane. There is well corticated bony fragment below the medial malleolus. Ankle joint is normally spaced and aligned. Mild spurring noted from the anterior distal tibial articular surface, from the lateral margin of the tibial dome and mild irregularity is noted along the posterior articular margin of the distal tibia. Moderate plantar and dorsal calcaneal spurs. Soft tissues are unremarkable. IMPRESSION: 1. No acute fracture. 2. No evidence of loosening of the orthopedic hardware. 3. Mild ankle joint degenerative changes. 4. Calcaneal spurs. Electronically Signed   By: Lajean Manes M.D.   On: 09/24/2020 20:53    Procedures Procedures   Medications Ordered in ED Medications - No data to display  ED Course  I have reviewed the triage vital signs and the nursing notes.  Pertinent labs & imaging results that were available during my care of the patient were reviewed by me and considered in my medical decision making (see chart for details).    MDM Rules/Calculators/A&P                          Patient is 50 year old female with left ankle pain she has some tenderness palpation of the left medial malleolus.  She is otherwise normal examination with good distal neurovascular status.  She does have a bone spur on the x-ray and no abnormalities otherwise.  She is  good movement of the ankle and phalanges of the toes.  She has no other concerning symptoms and no other physical exam findings.  X-ray reviewed by myself.  Agree of radiology read.  Will place in ankle brace and recommend she use her home crutches.  She will follow-up with [facility].  Rest ice elevate Tylenol and ibuprofen.  Final Clinical Impression(s) / ED Diagnoses Final diagnoses:  Bone spur  Acute left ankle pain    Rx / DC Orders ED Discharge Orders    None       Tedd Sias, [place] 09/24/20 2206    Davonna Belling, MD 09/24/20 2349

## 2020-09-24 NOTE — ED Triage Notes (Signed)
Pt c/o left ankle pain that hurts constantly and goes numb when she stands on it x 1 week.

## 2021-12-18 ENCOUNTER — Emergency Department (HOSPITAL_COMMUNITY)
Admission: EM | Admit: 2021-12-18 | Discharge: 2021-12-18 | Disposition: A | Discharge status: Other Acute Inpatient Hospital | Payer: Medicaid Other | Attending: Emergency Medicine | Admitting: Emergency Medicine

## 2021-12-18 ENCOUNTER — Encounter (HOSPITAL_COMMUNITY): Payer: Self-pay

## 2021-12-18 ENCOUNTER — Encounter (HOSPITAL_COMMUNITY): Payer: Self-pay | Admitting: Psychiatry

## 2021-12-18 ENCOUNTER — Other Ambulatory Visit: Payer: Self-pay

## 2021-12-18 ENCOUNTER — Inpatient Hospital Stay (HOSPITAL_COMMUNITY)
Admission: AD | Admit: 2021-12-18 | Discharge: 2021-12-23 | DRG: 885 | Disposition: A | Discharge status: Home / Self Care | Payer: Federal, State, Local not specified - Other | Source: Intra-hospital | Attending: Psychiatry | Admitting: Psychiatry

## 2021-12-18 DIAGNOSIS — F322 Major depressive disorder, single episode, severe without psychotic features: Secondary | ICD-10-CM | POA: Diagnosis present

## 2021-12-18 DIAGNOSIS — F1510 Other stimulant abuse, uncomplicated: Secondary | ICD-10-CM | POA: Diagnosis present

## 2021-12-18 DIAGNOSIS — Z79899 Other long term (current) drug therapy: Secondary | ICD-10-CM

## 2021-12-18 DIAGNOSIS — F1210 Cannabis abuse, uncomplicated: Secondary | ICD-10-CM | POA: Diagnosis present

## 2021-12-18 DIAGNOSIS — Z8249 Family history of ischemic heart disease and other diseases of the circulatory system: Secondary | ICD-10-CM | POA: Diagnosis not present

## 2021-12-18 DIAGNOSIS — F17290 Nicotine dependence, other tobacco product, uncomplicated: Secondary | ICD-10-CM | POA: Diagnosis present

## 2021-12-18 DIAGNOSIS — R45850 Homicidal ideations: Secondary | ICD-10-CM

## 2021-12-18 DIAGNOSIS — F419 Anxiety disorder, unspecified: Secondary | ICD-10-CM | POA: Diagnosis present

## 2021-12-18 DIAGNOSIS — Z62810 Personal history of physical and sexual abuse in childhood: Secondary | ICD-10-CM | POA: Diagnosis present

## 2021-12-18 DIAGNOSIS — K5900 Constipation, unspecified: Secondary | ICD-10-CM | POA: Diagnosis present

## 2021-12-18 DIAGNOSIS — G4700 Insomnia, unspecified: Secondary | ICD-10-CM | POA: Diagnosis present

## 2021-12-18 DIAGNOSIS — R45851 Suicidal ideations: Secondary | ICD-10-CM | POA: Diagnosis present

## 2021-12-18 DIAGNOSIS — F1020 Alcohol dependence, uncomplicated: Secondary | ICD-10-CM | POA: Insufficient documentation

## 2021-12-18 DIAGNOSIS — F1010 Alcohol abuse, uncomplicated: Secondary | ICD-10-CM | POA: Diagnosis present

## 2021-12-18 DIAGNOSIS — F32A Depression, unspecified: Secondary | ICD-10-CM | POA: Diagnosis not present

## 2021-12-18 DIAGNOSIS — Y904 Blood alcohol level of 80-99 mg/100 ml: Secondary | ICD-10-CM | POA: Insufficient documentation

## 2021-12-18 DIAGNOSIS — Z20822 Contact with and (suspected) exposure to covid-19: Secondary | ICD-10-CM | POA: Diagnosis present

## 2021-12-18 DIAGNOSIS — Z833 Family history of diabetes mellitus: Secondary | ICD-10-CM

## 2021-12-18 DIAGNOSIS — K219 Gastro-esophageal reflux disease without esophagitis: Secondary | ICD-10-CM | POA: Diagnosis present

## 2021-12-18 DIAGNOSIS — Z90710 Acquired absence of both cervix and uterus: Secondary | ICD-10-CM

## 2021-12-18 DIAGNOSIS — F1410 Cocaine abuse, uncomplicated: Secondary | ICD-10-CM | POA: Diagnosis present

## 2021-12-18 DIAGNOSIS — F10129 Alcohol abuse with intoxication, unspecified: Secondary | ICD-10-CM | POA: Diagnosis present

## 2021-12-18 LAB — COMPREHENSIVE METABOLIC PANEL
ALT: 32 U/L (ref 0–44)
AST: 48 U/L — ABNORMAL HIGH (ref 15–41)
Albumin: 3.8 g/dL (ref 3.5–5.0)
Alkaline Phosphatase: 106 U/L (ref 38–126)
Anion gap: 10 (ref 5–15)
BUN: 5 mg/dL — ABNORMAL LOW (ref 6–20)
CO2: 25 mmol/L (ref 22–32)
Calcium: 8.9 mg/dL (ref 8.9–10.3)
Chloride: 104 mmol/L (ref 98–111)
Creatinine, Ser: 0.6 mg/dL (ref 0.44–1.00)
GFR, Estimated: >60 mL/min (ref >60)
Glucose, Bld: 91 mg/dL (ref 70–99)
Potassium: 3.7 mmol/L (ref 3.5–5.1)
Sodium: 139 mmol/L (ref 135–145)
Total Bilirubin: 0.3 mg/dL (ref 0.3–1.2)
Total Protein: 7.6 g/dL (ref 6.5–8.1)

## 2021-12-18 LAB — CBC WITH DIFFERENTIAL/PLATELET
Abs Immature Granulocytes: 0.01 10*3/uL (ref 0.00–0.07)
Basophils Absolute: 0.1 10*3/uL (ref 0.0–0.1)
Basophils Relative: 1 %
Eosinophils Absolute: 0.4 10*3/uL (ref 0.0–0.5)
Eosinophils Relative: 8 %
HCT: 42.8 % (ref 36.0–46.0)
Hemoglobin: 14.2 g/dL (ref 12.0–15.0)
Immature Granulocytes: 0 %
Lymphocytes Relative: 17 %
Lymphs Abs: 0.8 10*3/uL (ref 0.7–4.0)
MCH: 33.5 pg (ref 26.0–34.0)
MCHC: 33.2 g/dL (ref 30.0–36.0)
MCV: 100.9 fL — ABNORMAL HIGH (ref 80.0–100.0)
Monocytes Absolute: 0.7 10*3/uL (ref 0.1–1.0)
Monocytes Relative: 15 %
Neutro Abs: 2.7 10*3/uL (ref 1.7–7.7)
Neutrophils Relative %: 59 %
Platelets: 244 10*3/uL (ref 150–400)
RBC: 4.24 MIL/uL (ref 3.87–5.11)
RDW: 14.5 % (ref 11.5–15.5)
WBC: 4.6 10*3/uL (ref 4.0–10.5)
nRBC: 0 % (ref 0.0–0.2)

## 2021-12-18 LAB — ETHANOL: Alcohol, Ethyl (B): 99 mg/dL — ABNORMAL HIGH (ref <10)

## 2021-12-18 LAB — RESP PANEL BY RT-PCR (FLU A&B, COVID) ARPGX2
Influenza A by PCR: NEGATIVE
Influenza B by PCR: NEGATIVE
SARS Coronavirus 2 by RT PCR: NEGATIVE

## 2021-12-18 MED ORDER — LORAZEPAM 1 MG PO TABS
1.0000 mg | ORAL_TABLET | Freq: Four times a day (QID) | ORAL | Status: AC
  Administered 2021-12-18 (×2): 1 mg via ORAL
  Filled 2021-12-18 (×2): qty 1

## 2021-12-18 MED ORDER — HYDROXYZINE HCL 25 MG PO TABS
25.0000 mg | ORAL_TABLET | Freq: Four times a day (QID) | ORAL | Status: AC | PRN
  Administered 2021-12-19 (×2): 25 mg via ORAL
  Filled 2021-12-18 (×2): qty 1

## 2021-12-18 MED ORDER — LORAZEPAM 1 MG PO TABS
1.0000 mg | ORAL_TABLET | Freq: Two times a day (BID) | ORAL | Status: AC
  Administered 2021-12-20 (×2): 1 mg via ORAL
  Filled 2021-12-18 (×2): qty 1

## 2021-12-18 MED ORDER — ACETAMINOPHEN 325 MG PO TABS: 650.0000 mg | ORAL_TABLET | ORAL | Status: DC | PRN

## 2021-12-18 MED ORDER — IBUPROFEN 400 MG PO TABS
400.0000 mg | ORAL_TABLET | Freq: Four times a day (QID) | ORAL | Status: DC | PRN
  Administered 2021-12-19 – 2021-12-23 (×5): 400 mg via ORAL
  Filled 2021-12-18 (×6): qty 1

## 2021-12-18 MED ORDER — ALUM & MAG HYDROXIDE-SIMETH 200-200-20 MG/5ML PO SUSP: 30.0000 mL | ORAL | Status: DC | PRN

## 2021-12-18 MED ORDER — LORAZEPAM 1 MG PO TABS
1.0000 mg | ORAL_TABLET | Freq: Four times a day (QID) | ORAL | Status: AC | PRN
  Administered 2021-12-19: 1 mg via ORAL
  Filled 2021-12-18: qty 1

## 2021-12-18 MED ORDER — LOPERAMIDE HCL 2 MG PO CAPS: 2.0000 mg | ORAL_CAPSULE | ORAL | Status: AC | PRN

## 2021-12-18 MED ORDER — ONDANSETRON HCL 4 MG PO TABS: 4.0000 mg | ORAL_TABLET | Freq: Three times a day (TID) | ORAL | Status: DC | PRN

## 2021-12-18 MED ORDER — ADULT MULTIVITAMIN W/MINERALS CH
1.0000 | ORAL_TABLET | Freq: Every day | ORAL | Status: DC
  Administered 2021-12-18 – 2021-12-23 (×6): 1 via ORAL
  Filled 2021-12-18 (×9): qty 1

## 2021-12-18 MED ORDER — MAGNESIUM HYDROXIDE 400 MG/5ML PO SUSP: 30.0000 mL | Freq: Every day | ORAL | Status: DC | PRN

## 2021-12-18 MED ORDER — TRAZODONE HCL 50 MG PO TABS
50.0000 mg | ORAL_TABLET | Freq: Every evening | ORAL | Status: DC | PRN
  Administered 2021-12-19 – 2021-12-22 (×4): 50 mg via ORAL
  Filled 2021-12-18 (×4): qty 1
  Filled 2021-12-18: qty 7

## 2021-12-18 MED ORDER — LORAZEPAM 1 MG PO TABS
1.0000 mg | ORAL_TABLET | Freq: Every day | ORAL | Status: AC
  Administered 2021-12-21: 1 mg via ORAL
  Filled 2021-12-18: qty 1

## 2021-12-18 MED ORDER — THIAMINE HCL 100 MG PO TABS
100.0000 mg | ORAL_TABLET | Freq: Every day | ORAL | Status: DC
  Administered 2021-12-19 – 2021-12-23 (×5): 100 mg via ORAL
  Filled 2021-12-18 (×7): qty 1

## 2021-12-18 MED ORDER — ACETAMINOPHEN 325 MG PO TABS: 650.0000 mg | ORAL_TABLET | Freq: Four times a day (QID) | ORAL | Status: DC | PRN

## 2021-12-18 MED ORDER — NICOTINE 21 MG/24HR TD PT24
21.0000 mg | MEDICATED_PATCH | Freq: Every day | TRANSDERMAL | Status: DC
  Administered 2021-12-19 – 2021-12-22 (×3): 21 mg via TRANSDERMAL
  Filled 2021-12-18 (×7): qty 1

## 2021-12-18 MED ORDER — LORAZEPAM 1 MG PO TABS
1.0000 mg | ORAL_TABLET | Freq: Three times a day (TID) | ORAL | Status: AC
  Administered 2021-12-19 (×3): 1 mg via ORAL
  Filled 2021-12-18 (×3): qty 1

## 2021-12-18 MED ORDER — ALUM & MAG HYDROXIDE-SIMETH 200-200-20 MG/5ML PO SUSP: 30.0000 mL | Freq: Four times a day (QID) | ORAL | Status: DC | PRN

## 2021-12-18 MED ORDER — ONDANSETRON 4 MG PO TBDP: 4.0000 mg | ORAL_TABLET | Freq: Four times a day (QID) | ORAL | Status: AC | PRN

## 2021-12-18 MED ORDER — HYDROXYZINE HCL 25 MG PO TABS: 25.0000 mg | ORAL_TABLET | Freq: Three times a day (TID) | ORAL | Status: DC | PRN

## 2021-12-18 MED ORDER — THIAMINE HCL 100 MG/ML IJ SOLN
100.0000 mg | Freq: Once | INTRAMUSCULAR | Status: AC
  Administered 2021-12-18: 100 mg via INTRAMUSCULAR
  Filled 2021-12-18: qty 2
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
## 2021-12-18 NOTE — [facility] Group Notes (Signed)
  Spiritual care group on grief and loss facilitated by chaplain Katy Jarrel Knoke, [facility]   Group Goal:   Support / Education around grief and loss   Members engage in facilitated group support and psycho-social education.   Group Description:   Following introductions and group rules, group members engaged in facilitated group dialog and support around topic of loss, with particular support around experiences of loss in their lives. Group Identified types of loss (relationships / self / things) and identified patterns, circumstances, and changes that precipitate losses. Reflected on thoughts / feelings around loss, normalized grief responses, and recognized variety in grief experience. Group noted Worden's four tasks of grief in discussion.   Group drew on Adlerian / Rogerian, narrative, MI,   Patient Progress: did not attend.  

## 2021-12-18 NOTE — BH Assessment (Signed)
Per [facility] at [facility], bed will be available for Pt later today. ?

## 2021-12-18 NOTE — Tx Team (Signed)
Initial Treatment Plan ?12/18/2021 ?5:10 PM ?Regino Schultze ?POE:[id number] ? ? ? ?PATIENT STRESSORS: ?Financial difficulties   ?Marital or family conflict   ?Substance abuse   ?Traumatic event   ? ? ?PATIENT STRENGTHS: ?Capable of independent living  ?Communication skills  ?Work skills  ? ? ?PATIENT IDENTIFIED PROBLEMS: ?Risk to harm self & others "I'm suicidal & I want to hurt my ex-girlfriend too".  ?  ?Alterations in mood (depressed & anxious) "I've been feeling sad /depressed, crying since last Tuesday".  ?  ?Substance abuse "I drink about 12-15 pkt beers everyday for years now. I know that I'm an alcoholic".  ?  ?Sleep disturbance "I've only been sleeping for 30 minutes /night since last Tuesday".  ?  ?Financial constraints "Limited finances".  ?  ? ?DISCHARGE CRITERIA:  ?Improved stabilization in mood, thinking, and/or behavior ?Verbal commitment to aftercare and medication compliance ? ?PRELIMINARY DISCHARGE PLAN: ?Outpatient therapy ?Return to previous living arrangement ?Return to previous work or school arrangements ? ?PATIENT/FAMILY INVOLVEMENT: ?This treatment plan has been presented to and reviewed with the patient, Mallory Neal. The patient have been given the opportunity to ask questions and make suggestions. ? ?Keane Police, RN ?12/18/2021, 5:10 PM ?

## 2021-12-18 NOTE — Plan of Care (Addendum)
Patient denies HI/AVH at this time.  She continues to endorse SI without a plan.  Patient agrees to contract for safety.  She is tearful at times, stating "the depression comes and goes." Patient makes fair eye contact with this writer, her affect is flat and her mood is sad and depression. She denies anxiety but rates her depression 10/10.  She denies any pain or discomfort. Will continue to monitor for safety. ?

## 2021-12-18 NOTE — ED Triage Notes (Signed)
Ccems from home. Cc of feeling suicidal after a break up on Tuesday. Does not have any plan.  ?

## 2021-12-18 NOTE — Progress Notes (Signed)
[facility] LCSW Progress Note ?  ?12/18/2021    10:20 AM ? ?ONESHA KREBBS  ? ?[id number]  ? ?Type of Contact and Topic:  Psychiatric Bed Placement  ? ?Pt accepted to [facility] 405-1    ? ?Patient meets inpatient criteria per Quintella Reichert, NP   ? ?The attending provider will be [place], MD  ? ?Call report to [phone]   ? ?Alinda Sierras, RN @ [facility] notified.    ? ?Pt scheduled  to arrive at [facility] @ 12 Noon.  ? ?Mariea Clonts, MSW, LCSW-A  ?10:21 AM 12/18/2021   ?  ? ?  ?  ? ? ? ? ?  ?

## 2021-12-18 NOTE — Progress Notes (Signed)
The patient had little to share with the group except that today is her first day in the hospital.  ?

## 2021-12-18 NOTE — Group Note (Signed)
LCSW Group Therapy Note ? ? ?Group Date: 12/18/2021 ?Start Time: 1300 ?End Time: 1400 ? ?Type of Therapy and Topic:  Group Therapy - Healthy vs Unhealthy Coping Skills ? ?Participation Level:  Active  ? ?Description of Group ?The focus of this group was to determine what unhealthy coping techniques typically are used by group members and what healthy coping techniques would be helpful in coping with various problems. Patients were guided in becoming aware of the differences between healthy and unhealthy coping techniques. Patients were asked to identify 2-3 healthy coping skills they would like to learn to use more effectively. ? ?Therapeutic Goals ?Patients learned that coping is what human beings do all day long to deal with various situations in their lives ?Patients defined and discussed healthy vs unhealthy coping techniques ?Patients identified their preferred coping techniques and identified whether these were healthy or unhealthy ?Patients determined 2-3 healthy coping skills they would like to become more familiar with and use more often. ?Patients provided support and ideas to each other ? ? ?Summary of Patient Progress:  Due to the acuity and complex discharge plans, group was not held. Patient was provided therapeutic worksheets and asked to meet with CSW as needed. ? ? ?Therapeutic Modalities ?Cognitive Behavioral Therapy ?Motivational Interviewing ? ?Darleen Crocker, LCSWA ?12/18/2021  1:34 PM   ? ?

## 2021-12-18 NOTE — ED Notes (Signed)
[place] voluntary admission form faxed at this time.  ?

## 2021-12-18 NOTE — BH Assessment (Signed)
Comprehensive Clinical Assessment (CCA) Note ? ?12/18/2021 ?M[facility]ory Neal ?[id number] ? ?DISPOSITION: Gave clinical report to Mallory Reichert, NP who determined Pt meets criteria for inpatient psychiatric treatment. Appropriate facilities will be contacted for placement. Notified [facility] and Mallory Humbles, RN of recommendation via secure message. ? ?The patient demonstrates the following risk factors for suicide: Chronic risk factors for suicide include: substance use disorder. Acute risk factors for suicide include: family or marital conflict, unemployment, and social withdrawal/isolation. Protective factors for this patient include: responsibility to others (children, family). Considering these factors, the overall suicide risk at this point appears to be high. Patient is not appropriate for outpatient follow up. ? ?M[facility] Neal from 12/18/2021 in [facility] from 09/24/2020 in [facility] from 09/08/2020 in [facility]  ?C-SSRS RISK CATEGORY High Risk No Risk No Risk  ? ?  ? ?Patient is a 51 year old female who presents unaccompanied to [facility] emergency department via EMS reporting depressive symptoms, suicidal ideation, homicidal ideation, and alcohol use. Patient states on May 4 her partner of 11 years ended their relationship to pursue a relationship with someone else. She says since then she has felt severely depressed with symptoms including crying spells, social withdrawal, loss of interest in usual pleasures, decreased concentration, fatigue, irritability, and feelings of hopelessness and worthlessness. Patient says that she cannot sleep and that she has not been eating regularly. She says she has not been doing any of her normal activities. She reports suicidal ideation with thoughts of shooting herself but does not have access to a gun. She denies any history of previous suicide attempts. She denies history of NSSIB. She says she  ?hates? her ex-partner and the person her partner is involved with now. She says she would like to shoot them but does not have access to a gun. She denies any history of aggressive behavior. She denies any history of auditory or visual hallucinations.  ? ?Patient states prior to this breakup she was drinking 15 to 20 cans of beer daily on an ongoing basis. She also states that she uses marijuana 3-4 times per week. She denies other substance use. She denies any history of alcohol withdrawal symptoms. She says she has been charged with a DUI in the past. ? ?Patient identifies the end of her relationship as her only stressor. She says that she is unemployed and lives with an elderly gentleman whom she cares for. She has two children, a 57 year old daughter and a 40 year old son, and describes her relationship with them as ?okay.? She cannot identify any social supports at this time. She denies history of abuse or trauma. She denies legal problems. She denies any history of inpatient or outpatient mental health or substance abuse treatment. ? ?Patient is dressed in Neal scrubs, alert and oriented x4. She speaks in a clear tone, at moderate volume and normal pace. Motor behavior appears normal. Mallory contact is good and she is tearful. Pt's mood is depressed and affect is congruent with mood. Thought process is coherent and relevant. There is no indication Pt is currently responding to internal stimuli or experiencing delusional thought content. She is cooperative and says she is willing to sign voluntarily into a psychiatric facility. ? ? ?Chief Complaint:  ?Chief Complaint  ?Patient presents with  ? Suicidal  ? ?Visit Diagnosis: ?F32.2 Major depressive disorder, Single episode, Severe ?F10.20 Alcohol use disorder, Severe ? ?CCA Screening, Triage and Referral (STR) ? ?Patient Reported Information ?  How did you hear about [place]? Self ? ?What Is the Reason for Your Visit/Call Today? Pt reports on 12/12/2021 her female  partner of 11 years ended their relationship and is pursuing a relationship with someone else. Pt says she is suicidal with thoughts of shooting herself. She also has thoughts of shooting her partner and the person she is now involved with. She says she does not have access to a gun. She says prior to the breakup she was drinking 15-20 cans of beer daily and smoking marijuana 3-4 times per week. She denies auditory or visual hallucinations. ? ?How Long Has This Been Causing You Problems? <Week ? ?What Do You Feel Would Help You the Most Today? Treatment for Depression or other mood problem ? ? ?Have You Recently Had Any Thoughts About Hurting Yourself? Yes ? ?Are You Planning to Commit Suicide/Harm Yourself At This time? Yes ? ? ?Have you Recently Had Thoughts About [place]? Yes ? ?Are You Planning to Harm Someone at This Time? Yes ? ?Explanation: Pt has thoughts of shooting her partner and the person her partner is seeing ? ? ?Have You Used Any Alcohol or Drugs in the Past 24 Hours? Yes ? ?How Long Ago Did You Use Drugs or Alcohol? No data recorded ?What Did You Use and How Much? Reports drinking 4 cans of beer today. ? ? ?Do You Currently Have a Therapist/Psychiatrist? No ? ?Name of Therapist/Psychiatrist: No data recorded ? ?Have You Been Recently Discharged From Any Office Practice or Programs? No data recorded ?Explanation of Discharge From Practice/Program: No data recorded ? ?  ?CCA Screening Triage Referral Assessment ?Type of Contact: Tele-Assessment ? ?Telemedicine Service Delivery: Telemedicine service delivery: This service was provided via telemedicine using a 2-way, interactive audio and video technology ? ?Is this Initial or Reassessment? Initial Assessment ? ?Date Telepsych consult ordered in [facility]:  12/18/21 ? ?Time Telepsych consult ordered in [facility]:  0333 ? ?Location of Assessment: [facility] ? ?Provider Location: [facility] Services ? ? ?Collateral Involvement: None ? ? ?Does Patient  Have a Stage manager Guardian? No data recorded ?Name and Contact of Legal Guardian: No data recorded ?If Minor and Not Living with Parent(s), Who has Custody? NA ? ?Is CPS involved or ever been involved? Never ? ?Is APS involved or ever been involved? Never ? ? ?Patient Determined To Be At Risk for Harm To Self or Others Based on Review of Patient Reported Information or Presenting Complaint? Yes, for Self-Harm ? ?Method: No data recorded ?Availability of Means: No data recorded ?Intent: No data recorded ?Notification Required: No data recorded ?Additional Information for Danger to Others Potential: No data recorded ?Additional Comments for Danger to Others Potential: No data recorded ?Are There Guns or Other Weapons in [place]? No data recorded ?Types of Guns/Weapons: No data recorded ?Are These Weapons Safely Secured?                            No data recorded ?Who Could Verify You Are Able To Have These Secured: No data recorded ?Do You Have any Outstanding Charges, Pending Court Dates, Parole/Probation? No data recorded ?Contacted To Inform of Risk of Harm To Self or Others: Unable to Contact: ? ? ? ?Does Patient Present under Involuntary Commitment? No ? ?IVC Papers Initial File Date: No data recorded ? ?[place] of Residence: [place] ? ? ?Patient Currently Receiving the Following Services: Not Receiving Services ? ? ?Determination  of Need: Emergent (2 hours) ? ? ?Options For Referral: Inpatient Hospitalization ? ? ? ? ?CCA Biopsychosocial ?Patient Reported Schizophrenia/Schizoaffective Diagnosis in Past: No ? ? ?Strengths: Pt is willing to participate in treatment. ? ? ?Mental Health Symptoms ?Depression:   ?Change in energy/activity; Difficulty Concentrating; Fatigue; Hopelessness; Increase/decrease in appetite; Irritability; Sleep (too much or little); Tearfulness; Worthlessness ?  ?Duration of Depressive symptoms:  ?Duration of Depressive Symptoms: Less than two weeks ?  ?Mania:   ?None ?   ?Anxiety:    ?Worrying; Difficulty concentrating; Fatigue; Irritability; Restlessness; Sleep; Tension ?  ?Psychosis:   ?None ?  ?Duration of Psychotic symptoms:    ?Trauma:   ?None ?  ?Obsessions:   ?None ?  ?C

## 2021-12-18 NOTE — ED Provider Notes (Signed)
?[facility] ?Provider Note ? ? ?CSN: [id number] ?Arrival date & time: 12/18/21  0310 ? ?  ? ?History ? ?Chief Complaint  ?Patient presents with  ? Suicidal  ? ? ?Mallory Neal is a 51 y.o. female. ? ?The history is provided by the patient.  ?She states that she broke up with her boyfriend 6 days ago and has been depressed since then.  She has had suicidal thoughts.  She states that if she had access to a gun, she would blow her brains out.  She has also had homicidal thoughts of shooting her ex-boyfriend and his new girlfriend.  She admits to crying spells, early morning awakening, anhedonia.  She has been drinking and admits to having had 4 beers today.  She has used marijuana, but not in the last 24 hours. ?  ?Home Medications ?Prior to Admission medications   ?Medication Sig Start Date End Date Taking? Authorizing Provider  ?HYDROcodone-acetaminophen (NORCO/VICODIN) 5-325 MG tablet Take 1 tablet by mouth every 4 (four) hours as needed. ?Patient taking differently: Take 1 tablet by mouth every 6 (six) hours as needed for moderate pain.  03/11/19   Rancour, Annie Main, MD  ?methocarbamol (ROBAXIN) 500 MG tablet Take 2 tablets (1,000 mg total) by mouth 4 (four) times daily as needed for muscle spasms (muscle spasm/pain). ?Patient taking differently: Take 500 mg by mouth 4 (four) times daily as needed for muscle spasms (muscle spasm/pain).  02/24/19   Francine Graven, DO  ?methocarbamol (ROBAXIN) 500 MG tablet Take 1 tablet (500 mg total) by mouth 2 (two) times daily. 09/08/20   Suzy Bouchard, PA-C  ?naproxen (NAPROSYN) 250 MG tablet Take 1 tablet (250 mg total) by mouth 2 (two) times daily as needed for mild pain or moderate pain (take with food). ?Patient not taking: Reported on 03/17/2019 02/24/19   Francine Graven, DO  ?naproxen (NAPROSYN) 500 MG tablet Take 1 tablet (500 mg total) by mouth 2 (two) times daily. 09/08/20   Suzy Bouchard, PA-C  ?ondansetron (ZOFRAN ODT) 4 MG disintegrating  tablet Take 1 tablet (4 mg total) by mouth every 8 (eight) hours as needed. 03/20/19   [name], MD  ?   ? ?Allergies    ?Codeine   ? ?Review of Systems   ?Review of Systems  ?All other systems reviewed and are negative. ? ?Physical Exam ?Updated Vital Signs ?BP 135/88 (BP Location: Left Arm)   Pulse 87   Temp 98.2 ?F (36.8 ?C)   Resp 18   Ht '5\' 6"'$  (1.676 m)   Wt 86.2 kg   LMP  (LMP Unknown) Comment: states she took a pill to stop periods  SpO2 92%   BMI 30.67 kg/m? normal ?Physical Exam ?Vitals and nursing note reviewed.  ?51 year old female, resting comfortably and in no acute distress. Vital signs are normal. Oxygen saturation is 92%, which is normal. ?Head is normocephalic and atraumatic. PERRLA, EOMI. Oropharynx is clear. ?Neck is nontender and supple without adenopathy or JVD. ?Back is nontender and there is no CVA tenderness. ?Lungs are clear without rales, wheezes, or rhonchi. ?Chest is nontender. ?Heart has regular rate and rhythm without murmur. ?Abdomen is soft, flat, nontender. ?Extremities have no cyanosis or edema, full range of motion is present. ?Skin is warm and dry without rash. ?Neurologic: Mental status is normal, cranial nerves are intact, moves all extremities equally. ?Psychiatric: No depressed affect. ? ?ED Results / Procedures / Treatments   ?Labs ?(all labs ordered are listed,  but only abnormal results are displayed) ?Labs Reviewed  ?RESP PANEL BY RT-PCR (FLU A&B, COVID) ARPGX2  ?ETHANOL  ?COMPREHENSIVE METABOLIC PANEL  ?CBC WITH DIFFERENTIAL/PLATELET  ?RAPID URINE DRUG SCREEN, HOSP PERFORMED  ? ?Procedures ?Procedures  ? ? ?Medications Ordered in ED ?Medications  ?alum & mag hydroxide-simeth (MAALOX/MYLANTA) 200-200-20 MG/5ML suspension 30 mL (has no administration in time range)  ?ondansetron (ZOFRAN) tablet 4 mg (has no administration in time range)  ?acetaminophen (TYLENOL) tablet 650 mg (has no administration in time range)  ? ? ?ED Course/ Medical Decision Making/  A&P ?  ?                        ?Medical Decision Making ?Amount and/or Complexity of Data Reviewed ?Labs: ordered. ? ?Risk ?OTC drugs. ?Prescription drug management. ? ? ?Reactive depression with homicidal and suicidal ideation.  We will check screening labs including ethanol level, request TTS consultation.  Old records are reviewed, and I see no prior visits for psychiatric issues. ? ?TTS consultation is appreciated.  Patient will need inpatient psychiatric care.  Currently we are awaiting appropriate placement, to be arranged by TTS. ? ?Final Clinical Impression(s) / ED Diagnoses ?Final diagnoses:  ?Suicidal ideation  ?Homicidal ideation  ? ? ?Rx / DC Orders ?ED Discharge Orders   ? ? None  ? ?  ? ? ?  ?Delora Fuel, MD ?[birth year] [phone] ? ?

## 2021-12-19 DIAGNOSIS — F1210 Cannabis abuse, uncomplicated: Secondary | ICD-10-CM | POA: Diagnosis present

## 2021-12-19 DIAGNOSIS — F1410 Cocaine abuse, uncomplicated: Secondary | ICD-10-CM | POA: Diagnosis present

## 2021-12-19 DIAGNOSIS — F1010 Alcohol abuse, uncomplicated: Secondary | ICD-10-CM | POA: Diagnosis present

## 2021-12-19 DIAGNOSIS — F322 Major depressive disorder, single episode, severe without psychotic features: Secondary | ICD-10-CM

## 2021-12-19 LAB — COMPREHENSIVE METABOLIC PANEL
ALT: 39 U/L (ref 0–44)
AST: 53 U/L — ABNORMAL HIGH (ref 15–41)
Albumin: 3.8 g/dL (ref 3.5–5.0)
Alkaline Phosphatase: 105 U/L (ref 38–126)
Anion gap: 13 (ref 5–15)
BUN: 8 mg/dL (ref 6–20)
CO2: 27 mmol/L (ref 22–32)
Calcium: 9.2 mg/dL (ref 8.9–10.3)
Chloride: 98 mmol/L (ref 98–111)
Creatinine, Ser: 0.63 mg/dL (ref 0.44–1.00)
GFR, Estimated: >60 mL/min (ref >60)
Glucose, Bld: 92 mg/dL (ref 70–99)
Potassium: 3.6 mmol/L (ref 3.5–5.1)
Sodium: 138 mmol/L (ref 135–145)
Total Bilirubin: 0.5 mg/dL (ref 0.3–1.2)
Total Protein: 7.8 g/dL (ref 6.5–8.1)

## 2021-12-19 LAB — URINALYSIS, COMPLETE (UACMP) WITH MICROSCOPIC
Bilirubin Urine: NEGATIVE
Glucose, UA: NEGATIVE mg/dL
Hgb urine dipstick: NEGATIVE
Ketones, ur: NEGATIVE mg/dL
Leukocytes,Ua: NEGATIVE
Nitrite: NEGATIVE
Protein, ur: NEGATIVE mg/dL
Specific Gravity, Urine: 1.025 (ref 1.005–1.030)
pH: 5 (ref 5.0–8.0)

## 2021-12-19 LAB — LIPID PANEL
Cholesterol: 179 mg/dL (ref 0–200)
HDL: 91 mg/dL (ref >40)
LDL Cholesterol: 79 mg/dL (ref 0–99)
Total CHOL/HDL Ratio: 2 RATIO
Triglycerides: 47 mg/dL (ref <150)
VLDL: 9 mg/dL (ref 0–40)

## 2021-12-19 LAB — CBC
HCT: 45.5 % (ref 36.0–46.0)
Hemoglobin: 15.5 g/dL — ABNORMAL HIGH (ref 12.0–15.0)
MCH: 34.8 pg — ABNORMAL HIGH (ref 26.0–34.0)
MCHC: 34.1 g/dL (ref 30.0–36.0)
MCV: 102.2 fL — ABNORMAL HIGH (ref 80.0–100.0)
Platelets: 268 10*3/uL (ref 150–400)
RBC: 4.45 MIL/uL (ref 3.87–5.11)
RDW: 14.5 % (ref 11.5–15.5)
WBC: 5.1 10*3/uL (ref 4.0–10.5)
nRBC: 0 % (ref 0.0–0.2)

## 2021-12-19 LAB — RAPID URINE DRUG SCREEN, HOSP PERFORMED
Amphetamines: NOT DETECTED
Barbiturates: NOT DETECTED
Benzodiazepines: POSITIVE — AB
Cocaine: POSITIVE — AB
Opiates: NOT DETECTED
Tetrahydrocannabinol: POSITIVE — AB

## 2021-12-19 LAB — HEMOGLOBIN A1C
Hgb A1c MFr Bld: 5.3 % (ref 4.8–5.6)
Mean Plasma Glucose: 105.41 mg/dL

## 2021-12-19 LAB — TSH: TSH: 3.425 u[IU]/mL (ref 0.350–4.500)

## 2021-12-19 MED ORDER — GUAIFENESIN ER 600 MG PO TB12
600.0000 mg | ORAL_TABLET | Freq: Two times a day (BID) | ORAL | Status: DC | PRN
  Administered 2021-12-19 (×2): 600 mg via ORAL
  Filled 2021-12-19 (×2): qty 1

## 2021-12-19 MED ORDER — NALTREXONE HCL 50 MG PO TABS: 50.0000 mg | ORAL_TABLET | Freq: Every day | ORAL | Status: DC

## 2021-12-19 MED ORDER — MENTHOL 3 MG MT LOZG
1.0000 | LOZENGE | OROMUCOSAL | Status: DC | PRN
  Administered 2021-12-19 (×4): 3 mg via ORAL
  Filled 2021-12-19 (×5): qty 9

## 2021-12-19 MED ORDER — SERTRALINE HCL 25 MG PO TABS
25.0000 mg | ORAL_TABLET | Freq: Every day | ORAL | Status: DC
  Administered 2021-12-19 – 2021-12-20 (×2): 25 mg via ORAL
  Filled 2021-12-19 (×5): qty 1

## 2021-12-19 MED ORDER — PANTOPRAZOLE SODIUM 40 MG PO TBEC
40.0000 mg | DELAYED_RELEASE_TABLET | Freq: Every day | ORAL | Status: DC
  Administered 2021-12-19 – 2021-12-23 (×5): 40 mg via ORAL
  Filled 2021-12-19: qty 7
  Filled 2021-12-19 (×7): qty 1

## 2021-12-19 MED ORDER — LORATADINE 10 MG PO TABS
10.0000 mg | ORAL_TABLET | Freq: Every day | ORAL | Status: DC | PRN
  Administered 2021-12-19 – 2021-12-20 (×2): 10 mg via ORAL
  Filled 2021-12-19 (×2): qty 1

## 2021-12-19 MED ORDER — DOCUSATE SODIUM 100 MG PO CAPS
100.0000 mg | ORAL_CAPSULE | Freq: Every day | ORAL | Status: DC
  Administered 2021-12-19 – 2021-12-20 (×2): 100 mg via ORAL
  Filled 2021-12-19 (×5): qty 1

## 2021-12-19 MED ORDER — GABAPENTIN 100 MG PO CAPS
100.0000 mg | ORAL_CAPSULE | Freq: Two times a day (BID) | ORAL | Status: DC
  Administered 2021-12-19 – 2021-12-21 (×4): 100 mg via ORAL
  Filled 2021-12-19 (×9): qty 1

## 2021-12-19 NOTE — BH Specialist Note (Signed)
Pt complains of dry cough, prescriber contacted and order given for throat lozenges and Guaifenesin PRN.  ?

## 2021-12-19 NOTE — Progress Notes (Signed)
Psychoeducational Group Note ? ?Date:  12/19/2021 ?Time:  2051 ? ?Group Topic/Focus:  ?Wrap-Up Group:   The focus of this group is to help patients review their daily goal of treatment and discuss progress on daily workbooks. ? ?Participation Level: Did Not Attend ? ?Participation Quality:  Not Applicable ? ?Affect:  Not Applicable ? ?Cognitive:  Not Applicable ? ?Insight:  Not Applicable ? ?Engagement in Group: Not Applicable ? ?Additional Comments:  The patient did not attend group this evening.  ? ?Archie Balboa S ?12/19/2021, 8:53 PM  ?
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
## 2021-12-19 NOTE — [facility] Group Notes (Signed)
Adult Orientation Group Note ? ?Date:  12/19/2021 ?Time:  10:54 AM ? ?Group Topic/Focus:  ?Orientation:   The focus of this group is to educate the patient on the purpose and policies of crisis stabilization and provide a format to answer questions about their admission.  The group details unit policies and expectations of patients while admitted. ? ?Participation Level:  Did Not Attend ? ?Mallory Neal ?12/19/2021, 10:54 AM ?

## 2021-12-19 NOTE — Progress Notes (Signed)
?   12/19/21 0900  ?Psych Admission Type (Psych Patients Only)  ?Admission Status Voluntary  ?Psychosocial Assessment  ?Patient Complaints Anxiety;Depression  ?Eye Contact Fair  ?Facial Expression Flat;Sad  ?Affect Appropriate to circumstance  ?Speech Logical/coherent  ?Interaction Cautious  ?Motor Activity Fidgety;Restless  ?Appearance/Hygiene Unremarkable  ?Behavior Characteristics Cooperative  ?Mood Depressed;Anxious  ?Thought Process  ?Coherency WDL  ?Content Blaming self  ?Delusions None reported or observed  ?Perception WDL  ?Hallucination None reported or observed  ?Judgment Poor  ?Confusion None  ?Danger to Self  ?Current suicidal ideation? Denies  ?Danger to Others  ?Danger to Others None reported or observed  ? ? ?
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
## 2021-12-20 ENCOUNTER — Encounter (HOSPITAL_COMMUNITY): Payer: Self-pay | Admitting: Psychiatry

## 2021-12-20 ENCOUNTER — Encounter (HOSPITAL_COMMUNITY): Payer: Self-pay

## 2021-12-20 MED ORDER — POLYETHYLENE GLYCOL 3350 17 G PO PACK
17.0000 g | PACK | Freq: Every day | ORAL | Status: DC
  Filled 2021-12-20 (×6): qty 1

## 2021-12-20 MED ORDER — SERTRALINE HCL 50 MG PO TABS
50.0000 mg | ORAL_TABLET | Freq: Every day | ORAL | Status: DC
  Administered 2021-12-21 – 2021-12-23 (×3): 50 mg via ORAL
  Filled 2021-12-20: qty 1
  Filled 2021-12-20: qty 7
  Filled 2021-12-20 (×4): qty 1

## 2021-12-20 MED ORDER — DOCUSATE SODIUM 100 MG PO CAPS: 100.0000 mg | ORAL_CAPSULE | Freq: Every day | ORAL | Status: DC | PRN

## 2021-12-20 MED ORDER — SENNA 8.6 MG PO TABS
1.0000 | ORAL_TABLET | Freq: Every day | ORAL | Status: DC
  Administered 2021-12-20 – 2021-12-21 (×2): 8.6 mg via ORAL
  Filled 2021-12-20 (×5): qty 1

## 2021-12-20 MED ORDER — ALBUTEROL SULFATE HFA 108 (90 BASE) MCG/ACT IN AERS: 2.0000 | INHALATION_SPRAY | Freq: Four times a day (QID) | RESPIRATORY_TRACT | Status: DC | PRN

## 2021-12-20 MED ORDER — BENZONATATE 100 MG PO CAPS: 100.0000 mg | ORAL_CAPSULE | Freq: Three times a day (TID) | ORAL | Status: DC | PRN

## 2021-12-20 NOTE — [facility] Counselor (Signed)
CSW provided the Pt with a packet that contains information including shelter and housing resources, free and reduced price food information, clothing resources, crisis center information, a [employer] card, and suicide prevention information.   

## 2021-12-20 NOTE — Progress Notes (Signed)
Pt denies SI/HI/AVH and verbally agrees to approach staff if these become apparent or before harming themselves/others. Rates depression 0/10. Rates anxiety 0/10. Rates pain 9/10.  Scheduled medications administered to pt, per MD orders. RN provided support and encouragement to pt. Q15 min safety checks implemented and continued. Pt safe on the unit. RN will continue to monitor and intervene as needed.  ? 12/20/21 0755  ?Psych Admission Type (Psych Patients Only)  ?Admission Status Voluntary  ?Psychosocial Assessment  ?Patient Complaints Sleep disturbance  ?Eye Contact Brief  ?Facial Expression Flat;Sad;Sullen  ?Affect Depressed;Sad;Flat  ?Speech Logical/coherent;Soft  ?Interaction Forwards little;Minimal  ?Motor Activity Slow  ?Appearance/Hygiene Unremarkable  ?Behavior Characteristics Cooperative;Appropriate to situation;Calm  ?Mood Depressed;Sad  ?Thought Process  ?Coherency WDL  ?Content WDL  ?Delusions None reported or observed  ?Perception WDL  ?Hallucination None reported or observed  ?Judgment Poor  ?Confusion None  ?Danger to Self  ?Current suicidal ideation? Denies  ?Danger to Others  ?Danger to Others None reported or observed  ? ? ?

## 2021-12-20 NOTE — [facility] IP Treatment Plan (Signed)
Interdisciplinary Treatment and Diagnostic Plan Update ? ?12/20/2021 ?Time of Session: 9:45am ?Mallory Neal ?MRN: [id number] ? ?Principal Diagnosis: Depression with suicidal ideation ? ?Secondary Diagnoses: Active Problems: ?  MDD (major depressive episode), single episode, severe, no psychosis ([place]) ?  Alcohol abuse ?  Cocaine abuse ([place]) ?  Cannabis abuse ? ? ?Current Medications:  ?Current Facility-Administered Medications  ?Medication Dose Route Frequency Provider Last Rate Last Admin  ? alum & mag hydroxide-simeth (MAALOX/MYLANTA) 200-200-20 MG/5ML suspension 30 mL  30 mL Oral Q4H PRN Ntuen, Kris Hartmann, FNP      ? docusate sodium (COLACE) capsule 100 mg  100 mg Oral Daily Nelda Marseille, Amy E, MD   100 mg at 12/20/21 0751  ? gabapentin (NEURONTIN) capsule 100 mg  100 mg Oral BID Rosezetta Schlatter, MD   100 mg at 12/20/21 0751  ? guaiFENesin (MUCINEX) 12 hr tablet 600 mg  600 mg Oral BID PRN Lindon Romp A, NP   600 mg at 12/19/21 2118  ? hydrOXYzine (ATARAX) tablet 25 mg  25 mg Oral Q6H PRN Janine Limbo, MD   25 mg at 12/19/21 2118  ? ibuprofen (ADVIL) tablet 400 mg  400 mg Oral Q6H PRN Janine Limbo, MD   400 mg at 12/20/21 0246  ? loperamide (IMODIUM) capsule 2-4 mg  2-4 mg Oral PRN Massengill, Nathan, MD      ? loratadine (CLARITIN) tablet 10 mg  10 mg Oral Daily PRN Lindon Romp A, NP   10 mg at 12/20/21 0755  ? LORazepam (ATIVAN) tablet 1 mg  1 mg Oral Q6H PRN Janine Limbo, MD   1 mg at 12/19/21 0905  ? LORazepam (ATIVAN) tablet 1 mg  1 mg Oral BID Massengill, Ovid Curd, MD   1 mg at 12/20/21 6333  ? Followed by  ? [START ON 12/21/2021] LORazepam (ATIVAN) tablet 1 mg  1 mg Oral Daily Massengill, Nathan, MD      ? magnesium hydroxide (MILK OF MAGNESIA) suspension 30 mL  30 mL Oral Daily PRN Ntuen, Kris Hartmann, FNP      ? menthol-cetylpyridinium (CEPACOL) lozenge 3 mg  1 lozenge Oral PRN Lindon Romp A, NP   3 mg at 12/19/21 2118  ? multivitamin with minerals tablet 1 tablet  1 tablet Oral Daily Massengill,  Nathan, MD   1 tablet at 12/20/21 5456  ? nicotine (NICODERM CQ - dosed in mg/24 hours) patch 21 mg  21 mg Transdermal Daily Massengill, Ovid Curd, MD   21 mg at 12/19/21 0904  ? ondansetron (ZOFRAN-ODT) disintegrating tablet 4 mg  4 mg Oral Q6H PRN Massengill, Nathan, MD      ? pantoprazole (PROTONIX) EC tablet 40 mg  40 mg Oral Daily Harlow Asa, MD   40 mg at 12/20/21 0751  ? sertraline (ZOLOFT) tablet 25 mg  25 mg Oral Daily Rosezetta Schlatter, MD   25 mg at 12/20/21 0751  ? thiamine tablet 100 mg  100 mg Oral Daily Massengill, Ovid Curd, MD   100 mg at 12/20/21 0751  ? traZODone (DESYREL) tablet 50 mg  50 mg Oral QHS PRN Janine Limbo, MD   50 mg at 12/19/21 2118  ? ?PTA Medications: ?Medications Prior to Admission  ?Medication Sig Dispense Refill Last Dose  ? HYDROcodone-acetaminophen (NORCO/VICODIN) 5-325 MG tablet Take 1 tablet by mouth every 4 (four) hours as needed. (Patient not taking: Reported on 12/18/2021) 10 tablet 0   ? methocarbamol (ROBAXIN) 500 MG tablet Take 2 tablets (1,000 mg total) by mouth 4 (four)  times daily as needed for muscle spasms (muscle spasm/pain). (Patient not taking: Reported on 12/18/2021) 25 tablet 0   ? methocarbamol (ROBAXIN) 500 MG tablet Take 1 tablet (500 mg total) by mouth 2 (two) times daily. (Patient not taking: Reported on 12/18/2021) 20 tablet 0   ? naproxen (NAPROSYN) 250 MG tablet Take 1 tablet (250 mg total) by mouth 2 (two) times daily as needed for mild pain or moderate pain (take with food). (Patient not taking: Reported on 03/17/2019) 14 tablet 0   ? naproxen (NAPROSYN) 500 MG tablet Take 1 tablet (500 mg total) by mouth 2 (two) times daily. (Patient not taking: Reported on 12/18/2021) 30 tablet 0   ? ondansetron (ZOFRAN ODT) 4 MG disintegrating tablet Take 1 tablet (4 mg total) by mouth every 8 (eight) hours as needed. (Patient not taking: Reported on 12/18/2021) 20 tablet 0   ? ? ?Patient Stressors: Financial difficulties   ?Marital or family conflict   ?Substance abuse    ?Traumatic event   ? ?Patient Strengths: Capable of independent living  ?Communication skills  ?Work skills  ? ?Treatment Modalities: Medication Management, Group therapy, Case management,  ?1 to 1 session with clinician, Psychoeducation, Recreational therapy. ? ? ?Physician Treatment Plan for Primary Diagnosis: Depression with suicidal ideation ?Long Term Goal(s): Improvement in symptoms so as ready for discharge  ? ?Short Term Goals: Ability to identify changes in lifestyle to reduce recurrence of condition will improve ?Ability to verbalize feelings will improve ?Ability to disclose and discuss suicidal ideas ?Ability to demonstrate self-control will improve ?Ability to identify and develop effective coping behaviors will improve ?Ability to maintain clinical measurements within normal limits will improve ?Compliance with prescribed medications will improve ?Ability to identify triggers associated with substance abuse/mental health issues will improve ? ?Medication Management: Evaluate patient's response, side effects, and tolerance of medication regimen. ? ?Therapeutic Interventions: 1 to 1 sessions, Unit Group sessions and Medication administration. ? ?Evaluation of Outcomes: Not Met ? ?Physician Treatment Plan for Secondary Diagnosis: Active Problems: ?  MDD (major depressive episode), single episode, severe, no psychosis ([place]) ?  Alcohol abuse ?  Cocaine abuse ([place]) ?  Cannabis abuse ? ?Long Term Goal(s): Improvement in symptoms so as ready for discharge  ? ?Short Term Goals: Ability to identify changes in lifestyle to reduce recurrence of condition will improve ?Ability to verbalize feelings will improve ?Ability to disclose and discuss suicidal ideas ?Ability to demonstrate self-control will improve ?Ability to identify and develop effective coping behaviors will improve ?Ability to maintain clinical measurements within normal limits will improve ?Compliance with prescribed medications will  improve ?Ability to identify triggers associated with substance abuse/mental health issues will improve    ? ?Medication Management: Evaluate patient's response, side effects, and tolerance of medication regimen. ? ?Therapeutic Interventions: 1 to 1 sessions, Unit Group sessions and Medication administration. ? ?Evaluation of Outcomes: Not Met ? ? ?RN Treatment Plan for Primary Diagnosis: Depression with suicidal ideation ?Long Term Goal(s): Knowledge of disease and therapeutic regimen to maintain health will improve ? ?Short Term Goals: Ability to remain free from injury will improve, Ability to verbalize frustration and anger appropriately will improve, Ability to demonstrate self-control, Ability to participate in decision making will improve, Ability to verbalize feelings will improve, Ability to identify and develop effective coping behaviors will improve, and Compliance with prescribed medications will improve ? ?Medication Management: RN will administer medications as ordered by provider, will assess and evaluate patient's response and provide education to patient for prescribed  medication. RN will report any adverse and/or side effects to prescribing provider. ? ?Therapeutic Interventions: 1 on 1 counseling sessions, Psychoeducation, Medication administration, Evaluate responses to treatment, Monitor vital signs and CBGs as ordered, Perform/monitor CIWA, COWS, AIMS and Fall Risk screenings as ordered, Perform wound care treatments as ordered. ? ?Evaluation of Outcomes: Not Met ? ? ?LCSW Treatment Plan for Primary Diagnosis: Depression with suicidal ideation ?Long Term Goal(s): Safe transition to appropriate next level of care at discharge, Engage patient in therapeutic group addressing interpersonal concerns. ? ?Short Term Goals: Engage patient in aftercare planning with referrals and resources, Increase social support, Increase ability to appropriately verbalize feelings, Increase emotional regulation,  Facilitate patient progression through stages of change regarding substance use diagnoses and concerns, Identify triggers associated with mental health/substance abuse issues, and Increase skills for wellness and recovery

## 2021-12-20 NOTE — [facility] Suicide Risk Assessment (Signed)
[facility] INPATIENT:  Family/Significant Other Suicide Prevention Education ? ?Suicide Prevention Education:  ?Patient Refusal for Family/Significant Other Suicide Prevention Education: The patient Mallory Neal has refused to provide written consent for family/significant other to be provided Family/Significant Other Suicide Prevention Education during admission and/or prior to discharge.  Physician notified. ? ?Darleen Crocker ?12/20/2021, 10:02 AM ?
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
## 2021-12-20 NOTE — Group Note (Signed)
LCSW Group Therapy Note ? ? ?Group Date: 12/20/2021 ?Start Time: 1300 ?End Time: 1400 ? ? ?Type of Therapy and Topic:  Group Therapy: Boundaries ? ?Participation Level:  Active ? ?Description of Group: ?This group will address the use of boundaries in their personal lives. Patients will explore why boundaries are important, the difference between healthy and unhealthy boundaries, and negative and postive outcomes of different boundaries and will look at how boundaries can be crossed.  Patients will be encouraged to identify current boundaries in their own lives and identify what kind of boundary is being set. Facilitators will guide patients in utilizing problem-solving interventions to address and correct types boundaries being used and to address when no boundary is being used. Understanding and applying boundaries will be explored and addressed for obtaining and maintaining a balanced life. Patients will be encouraged to explore ways to assertively make their boundaries and needs known to significant others in their lives, using other group members and facilitator for role play, support, and feedback. ? ?Therapeutic Goals: ? ?1.  Patient will identify areas in their life where setting clear boundaries could be  used to improve their life.  ?2.  Patient will identify signs/triggers that a boundary is not being respected. ?3.  Patient will identify two ways to set boundaries in order to achieve balance in  their lives: ?4.  Patient will demonstrate ability to communicate their needs and set boundaries  through discussion and/or role plays ? ?Summary of Patient Progress:  The Pt was present/active throughout the session and proved open to feedback from [place] and peers. Patient demonstrated insight into the subject matter, was respectful of peers, and was present throughout the entire session.  The Pt states that they do not have a support person who they have a healthy boundary with at this time. ? ?Therapeutic  Modalities:   ?Cognitive Behavioral Therapy ?Solution-Focused Therapy ? ?Darleen Crocker, LCSWA ?12/20/2021  2:04 PM   ? ?

## 2021-12-20 NOTE — Progress Notes (Signed)
Patient did attend the evening speaker NA meeting.  

## 2021-12-20 NOTE — Progress Notes (Signed)
?   12/19/21 2300  ?Psych Admission Type (Psych Patients Only)  ?Admission Status Voluntary  ?Psychosocial Assessment  ?Patient Complaints Anxiety;Depression  ?Eye Contact Brief  ?Facial Expression Blank;Sad  ?Affect Anxious;Depressed;Flat  ?Speech Logical/coherent  ?Interaction Assertive  ?Motor Activity Slow  ?Appearance/Hygiene Unremarkable  ?Behavior Characteristics Appropriate to situation  ?Mood Anxious;Depressed  ?Thought Process  ?Coherency WDL  ?Content WDL  ?Delusions None reported or observed  ?Perception WDL  ?Hallucination None reported or observed  ?Judgment Poor  ?Confusion None  ?Danger to Self  ?Current suicidal ideation? Denies  ?Self-Injurious Behavior No self-injurious ideation or behavior indicators observed or expressed   ?Agreement Not to Harm Self Yes  ?Description of Agreement verbal  ?Danger to Others  ?Danger to Others None reported or observed  ? ? ?

## 2021-12-20 NOTE — Group Note (Signed)
Recreation Therapy Group Note ? ? ?Group Topic:Stress Management  ?Group Date: 12/20/2021 ?Start Time: 0930 ?End Time: 8786 ?Facilitators: Victorino Sparrow, LRT,CTRS ?Location: [place] ? ? ?Goal Area(s) Addresses:  ?Patient will identify positive stress management techniques. ?Patient will identify benefits of using stress management post d/c. ?  ?Group Description:  Meditation.  LRT played a meditation that focused on finding and building morning energy for the day.  Patients were to follow along and the meditation led patients to focus on their breathing and channel energy to each chakra point in the body.  ? ? ? ? ?Affect/Mood: N/A ?  ?Participation Level: Did not attend ?  ? ?Clinical Observations/Individualized Feedback:   ? ? ?Plan: Continue to engage patient in RT group sessions 2-3x/week. ? ? ?Victorino Sparrow, LRT,CTRS ?12/20/2021 1:07 PM ?

## 2021-12-20 NOTE — Progress Notes (Addendum)
[facility] MD Progress Note ? ?12/20/2021 9:46 PM ?Mallory Neal  ?MRN:  [id number] ?Reason for Admission: This is a 51 year old female with no reported psychiatric diagnoses but alcohol use disorder-severe/dependent who presented voluntarily from [facility] ED with SI/HI after a break-up.  This is her first inpatient psychiatric admission. ? ?Subjective:  NAEON. PRNS: Hydroxyzine x 2 and Trazodone x 1.  ? ?Patient is seen at bedside. She remains with depressed mood, but denies SI and HI today. She reports sleeping well, improved appetite, and improved withdrawal sx. Today, she continues to endorse HA, dizziness upon standing, nausea, and constipation, but denies chest pain, dyspnea, tremors, diaphoresis, and chills. As well, she has not had improvements to her cough. She endorses the desire for inpatient rehabilitation. She denies acute concerns or complaints today.  ? ?Principal Problem: Depression with suicidal ideation ?Diagnosis: Active Problems: ?  MDD (major depressive episode), single episode, severe, no psychosis ([place]) ?  Alcohol abuse ?  Cocaine abuse ([place]) ?  Cannabis abuse ? ? ?Total Time spent with patient: 20 minutes ? ?Past Psychiatric History: See H&P ? ?Past Medical History:  ?Past Medical History:  ?Diagnosis Date  ? Anemia   ? Arthritis   ? Chest pain   ? Heart murmur   ?  ?Past Surgical History:  ?Procedure Laterality Date  ? ABDOMINAL HYSTERECTOMY N/A 12/05/2016  ? Procedure: HYSTERECTOMY ABDOMINAL;  Surgeon: Florian Buff, MD;  Location: [facility];  Service: Gynecology;  Laterality: N/A;  ? CHOLECYSTECTOMY    ? ORIF ANKLE FRACTURE Left 03/19/2019  ? Procedure: OPEN REDUCTION INTERNAL FIXATION (ORIF) ANKLE WITH SYNDESMOSIS FIXATION;  Surgeon: [name], MD;  Location: [facility];  Service: Orthopedics;  Laterality: Left;  90 MINS  ? SALPINGOOPHORECTOMY Bilateral 12/05/2016  ? Procedure: SALPINGO OOPHORECTOMY;  Surgeon: Florian Buff, MD;  Location: [facility];  Service: Gynecology;  Laterality: Bilateral;  ? ?Family  History:  ?Family History  ?Problem Relation Age of Onset  ? Diabetes Paternal Grandfather   ? Heart disease Paternal Grandmother   ? Heart disease Maternal Grandmother   ? Cancer Father   ?     lung  ? ?Family Psychiatric  History: See H&P ?Social History:  ?Social History  ? ?Substance and Sexual Activity  ?Alcohol Use Yes  ? Alcohol/week: 15.0 standard drinks  ? Types: 15 Cans of beer per week  ? Comment: "I drink 12-15 pks beer everyday for years now"  ?   ?Social History  ? ?Substance and Sexual Activity  ?Drug Use Yes  ? Types: Marijuana  ? Comment: last use x 1 week ago  ?  ?Social History  ? ?Socioeconomic History  ? Marital status: Single  ?  Spouse name: Not on file  ? Number of children: Not on file  ? Years of education: Not on file  ? Highest education level: Not on file  ?Occupational History  ? Not on file  ?Tobacco Use  ? Smoking status: Never  ? Smokeless tobacco: Current  ?  Types: Snuff  ?Substance and Sexual Activity  ? Alcohol use: Yes  ?  Alcohol/week: 15.0 standard drinks  ?  Types: 15 Cans of beer per week  ?  Comment: "I drink 12-15 pks beer everyday for years now"  ? Drug use: Yes  ?  Types: Marijuana  ?  Comment: last use x 1 week ago  ? Sexual activity: Not Currently  ?  Birth control/protection: None  ?Other Topics Concern  ? Not on file  ?  Social History Narrative  ? Mallory Neal is a 51 y/o caucasian female. Lives with an older gentleman friend. No stable job / income at this time.  ? ?Social Determinants of Health  ? ?Financial Resource Strain: Not on file  ?Food Insecurity: Not on file  ?Transportation Needs: Not on file  ?Physical Activity: Not on file  ?Stress: Not on file  ?Social Connections: Not on file  ? ?Additional Social History:  ?  ?  ?  ? ?Sleep: Fair ? ?Appetite:  Fair ? ?Current Medications: ?Current Facility-Administered Medications  ?Medication Dose Route Frequency Provider Last Rate Last Admin  ? albuterol (VENTOLIN HFA) 108 (90 Base) MCG/ACT inhaler 2 puff  2 puff Inhalation  Q6H PRN Nelda Marseille, Hilario Robarts E, MD      ? alum & mag hydroxide-simeth (MAALOX/MYLANTA) 200-200-20 MG/5ML suspension 30 mL  30 mL Oral Q4H PRN Ntuen, Kris Hartmann, FNP      ? benzonatate (TESSALON) capsule 100 mg  100 mg Oral TID PRN Harlow Asa, MD      ? docusate sodium (COLACE) capsule 100 mg  100 mg Oral Daily PRN Nelda Marseille, Nichola Cieslinski E, MD      ? gabapentin (NEURONTIN) capsule 100 mg  100 mg Oral BID Rosezetta Schlatter, MD   100 mg at 12/20/21 1644  ? guaiFENesin (MUCINEX) 12 hr tablet 600 mg  600 mg Oral BID PRN Lindon Romp A, NP   600 mg at 12/19/21 2118  ? hydrOXYzine (ATARAX) tablet 25 mg  25 mg Oral Q6H PRN Janine Limbo, MD   25 mg at 12/19/21 2118  ? ibuprofen (ADVIL) tablet 400 mg  400 mg Oral Q6H PRN Janine Limbo, MD   400 mg at 12/20/21 0246  ? loperamide (IMODIUM) capsule 2-4 mg  2-4 mg Oral PRN Massengill, Nathan, MD      ? loratadine (CLARITIN) tablet 10 mg  10 mg Oral Daily PRN Lindon Romp A, NP   10 mg at 12/20/21 0755  ? LORazepam (ATIVAN) tablet 1 mg  1 mg Oral Q6H PRN Janine Limbo, MD   1 mg at 12/19/21 0905  ? [START ON 12/21/2021] LORazepam (ATIVAN) tablet 1 mg  1 mg Oral Daily Massengill, Nathan, MD      ? magnesium hydroxide (MILK OF MAGNESIA) suspension 30 mL  30 mL Oral Daily PRN Ntuen, Kris Hartmann, FNP      ? menthol-cetylpyridinium (CEPACOL) lozenge 3 mg  1 lozenge Oral PRN Lindon Romp A, NP   3 mg at 12/19/21 2118  ? multivitamin with minerals tablet 1 tablet  1 tablet Oral Daily Massengill, Nathan, MD   1 tablet at 12/20/21 5638  ? nicotine (NICODERM CQ - dosed in mg/24 hours) patch 21 mg  21 mg Transdermal Daily Massengill, Ovid Curd, MD   21 mg at 12/19/21 0904  ? ondansetron (ZOFRAN-ODT) disintegrating tablet 4 mg  4 mg Oral Q6H PRN Massengill, Nathan, MD      ? pantoprazole (PROTONIX) EC tablet 40 mg  40 mg Oral Daily Harlow Asa, MD   40 mg at 12/20/21 0751  ? polyethylene glycol (MIRALAX / GLYCOLAX) packet 17 g  17 g Oral Daily Rosezetta Schlatter, MD      ? senna (SENOKOT) tablet  8.6 mg  1 tablet Oral QHS Rosezetta Schlatter, MD   8.6 mg at 12/20/21 2106  ? sertraline (ZOLOFT) tablet 25 mg  25 mg Oral Daily Rosezetta Schlatter, MD   25 mg at 12/20/21 0751  ? thiamine tablet 100 mg  100  mg Oral Daily Massengill, Ovid Curd, MD   100 mg at 12/20/21 0751  ? traZODone (DESYREL) tablet 50 mg  50 mg Oral QHS PRN Janine Limbo, MD   50 mg at 12/20/21 2105  ? ? ?Lab Results:  ?Results for orders placed or performed during the hospital encounter of 12/18/21 (from the past 48 hour(s))  ?CBC     Status: Abnormal  ? Collection Time: 12/19/21  6:14 AM  ?Result Value Ref Range  ? WBC 5.1 4.0 - 10.5 K/uL  ? RBC 4.45 3.87 - 5.11 MIL/uL  ? Hemoglobin 15.5 (H) 12.0 - 15.0 g/dL  ? HCT 45.5 36.0 - 46.0 %  ? MCV 102.2 (H) 80.0 - 100.0 fL  ? MCH 34.8 (H) 26.0 - 34.0 pg  ? MCHC 34.1 30.0 - 36.0 g/dL  ? RDW 14.5 11.5 - 15.5 %  ? Platelets 268 150 - 400 K/uL  ? nRBC 0.0 0.0 - 0.2 %  ?  Comment: Performed at [facility], [place] [address] [place], [place] [address]  ?Comprehensive metabolic panel     Status: Abnormal  ? Collection Time: 12/19/21  6:14 AM  ?Result Value Ref Range  ? Sodium 138 135 - 145 mmol/L  ? Potassium 3.6 3.5 - 5.1 mmol/L  ? Chloride 98 98 - 111 mmol/L  ? CO2 27 22 - 32 mmol/L  ? Glucose, Bld 92 70 - 99 mg/dL  ?  Comment: Glucose reference range applies only to samples taken after fasting for at least 8 hours.  ? BUN 8 6 - 20 mg/dL  ? Creatinine, Ser 0.63 0.44 - 1.00 mg/dL  ? Calcium 9.2 8.9 - 10.3 mg/dL  ? Total Protein 7.8 6.5 - 8.1 g/dL  ? Albumin 3.8 3.5 - 5.0 g/dL  ? AST 53 (H) 15 - 41 U/L  ? ALT 39 0 - 44 U/L  ? Alkaline Phosphatase 105 38 - 126 U/L  ? Total Bilirubin 0.5 0.3 - 1.2 mg/dL  ? GFR, Estimated >60 >60 mL/min  ?  Comment: (NOTE) ?Calculated using the CKD-EPI Creatinine Equation (2021) ?  ? Anion gap 13 5 - 15  ?  Comment: Performed at [facility], [place] [address], [place], [place] [address]  ?Hemoglobin A1c     Status: None  ? Collection Time:  12/19/21  6:14 AM  ?Result Value Ref Range  ? Hgb A1c MFr Bld 5.3 4.8 - 5.6 %  ?  Comment: (NOTE) ?Pre diabetes:          5.7%-6.4% ? ?Diabetes:              >6.4% ? ?Glycemic control for   <7.0% ?adults with

## 2021-12-21 ENCOUNTER — Inpatient Hospital Stay (HOSPITAL_COMMUNITY)
Admit: 2021-12-21 | Discharge: 2021-12-21 | Disposition: A | Discharge status: Home / Self Care | Payer: Federal, State, Local not specified - Other | Attending: Emergency Medicine | Admitting: Emergency Medicine

## 2021-12-21 LAB — HEPATITIS PANEL, ACUTE
HCV Ab: NONREACTIVE
Hep A IgM: NONREACTIVE
Hep B C IgM: NONREACTIVE
Hepatitis B Surface Ag: NONREACTIVE

## 2021-12-21 MED ORDER — GABAPENTIN 100 MG PO CAPS
100.0000 mg | ORAL_CAPSULE | Freq: Three times a day (TID) | ORAL | Status: DC
  Administered 2021-12-21 – 2021-12-23 (×6): 100 mg via ORAL
  Filled 2021-12-21 (×2): qty 21
  Filled 2021-12-21 (×2): qty 1
  Filled 2021-12-21: qty 21
  Filled 2021-12-21 (×7): qty 1

## 2021-12-21 NOTE — Progress Notes (Signed)
Pt denies SI/HI/AVH and verbally agrees to approach staff if these become apparent or before harming themselves/others. Rates depression 0/10. Rates anxiety 7/10. Rates pain 0/10.  Pt was out of her room more today and seen playing games with other pts. Pt seemed much more interactive with RN this am, as well with other pts. Pt has not complained of anything other than not sleeping the best due to her coughing fits but pt has been stable otherwise. Scheduled medications administered to pt, per MD orders. RN provided support and encouragement to pt. Q15 min safety checks implemented and continued. Pt safe on the unit. RN will continue to monitor and intervene as needed.  ? 12/21/21 0751  ?Psych Admission Type (Psych Patients Only)  ?Admission Status Voluntary  ?Psychosocial Assessment  ?Patient Complaints Anxiety  ?Eye Contact Fair  ?Facial Expression Sad;Flat  ?Affect Anxious;Sad  ?Speech Logical/coherent  ?Interaction Assertive  ?Motor Activity Other (Comment) ?(WDL)  ?Appearance/Hygiene Unremarkable  ?Behavior Characteristics Cooperative;Appropriate to situation;Calm  ?Mood Anxious;Pleasant  ?Thought Process  ?Coherency WDL  ?Content WDL  ?Delusions None reported or observed  ?Perception WDL  ?Hallucination None reported or observed  ?Judgment Impaired  ?Confusion None  ?Danger to Self  ?Current suicidal ideation? Denies  ?Danger to Others  ?Danger to Others None reported or observed  ? ? ?

## 2021-12-21 NOTE — Progress Notes (Signed)
[facility] Group Notes:  (Nursing/MHT/Case Management/Adjunct) ? ?Date:  12/21/2021  ?Time:  2015 ? ?Type of Therapy:   wrap up group ? ?Participation Level:  Active ? ?Participation Quality:  Appropriate, Attentive, Sharing, and Supportive ? ?Affect:  Depressed and Flat ? ?Cognitive:  Alert ? ?Insight:  Improving ? ?Engagement in Group:  Engaged ? ?Modes of Intervention:  Clarification, Education, and Support ? ?Summary of Progress/Problems: Positive thinking and positive change were discussed.  ? ?Winfield Rast S ?12/21/2021, 9:19 PM ?

## 2021-12-21 NOTE — BHH Counselor (Signed)
CSW spoke with admissions at [facility] and was informed that the Pt had been denied due to having Family Planning Medicaid out of [place].  CSW will send the Pt's information to [facility] but they are no taking new patients until after 01/11/2022.  CSW will inform the Pt and the providers of this information.  ?

## 2021-12-21 NOTE — Progress Notes (Signed)
[facility] MD Progress Note ? ?12/21/2021 6:39 AM ?Mallory Neal  ?MRN:  [id number] ? ?Chief Complaint: SI and HI; alcohol abuse; depression ? ?Reason for Admission: This is a 51 year old female with no reported psychiatric diagnoses but alcohol use disorder-severe/dependent who [facility] from [facility] with SI/HI after a break-up.  This is her first inpatient psychiatric admission.The patient is currently on Hospital Day 3.  ? ? ?Chart Review from last 24 hours:  ?The patient's chart was reviewed and nursing notes were reviewed. The patient's case was discussed in multidisciplinary team meeting. Per nursing, patient had no acute safety issues or behavioral issues and attended select groups. Per MAR she was compliant with scheduled medications except miralax. She required Claritin PRN, Advil PRN and Trazodone PRN. ? ?Information Obtained Today During Patient Interview: ?The patient was seen and evaluated on the unit. On assessment today the patient reports that she slept well overnight and has a good appetite.  She states that her mood is improving and she "mingled" last night with other patients in the dayroom but still has some residual anxiety.  She was up until 10:00pm playing cards with peers and states that she is starting to feel more social.  She has not had contact with any family or friends since admission.  She states that she is not having any signs of alcohol withdrawal or cravings but is having cravings for snuff.  She denies SI, HI, AVH, paranoia, ideas of reference, or first rank symptoms.  She did have a small bowel movement this morning and drank coffee with hopes that she will have improvement in her constipation today.  She voices no other physical complaints other than ongoing cough.  Plans to get a chest x-ray and repeat her COVID test.  She was encouraged to use the Mucinex, Tessalon Perles and albuterol for cough.  She is hopeful to get into a residential rehab program.  We discussed that we  are titrating up on her Zoloft today to 50 mg and she was reminded to watch for GI side effects or SI with dose increase.  We discussed titration up on her Neurontin to 100 mg 3 times daily to also help with any potential cravings/relapse and to help with any residual anxiety. ? ?Principal Problem: Depression with suicidal ideation ?Diagnosis: Active Problems: ?  MDD (major depressive episode), single episode, severe, no psychosis ([place]) ?  Alcohol abuse ?  Cocaine abuse ([place]) ?  Cannabis abuse ? ?Total Time Spent in Direct Patient Care:  ?I personally spent 25 minutes on the unit in direct patient care. The direct patient care time included face-to-face time with the patient, reviewing the patient's chart, communicating with other professionals, and coordinating care. Greater than 50% of this time was spent in counseling or coordinating care with the patient regarding goals of hospitalization, psycho-education, and discharge planning needs. ? ? ?Past Psychiatric History: See H&P ? ?Past Medical History:  ?Past Medical History:  ?Diagnosis Date  ? Anemia   ? Arthritis   ? Chest pain   ? Heart murmur   ?  ?Past Surgical History:  ?Procedure Laterality Date  ? ABDOMINAL HYSTERECTOMY N/A 12/05/2016  ? Procedure: HYSTERECTOMY ABDOMINAL;  Surgeon: Florian Buff, MD;  Location[facility];  Service: Gynecology;  Laterality: N/A;  ? CHOLECYSTECTOMY    ? ORIF ANKLE FRACTURE Left 03/19/2019  ? Procedure: OPEN REDUCTION INTERNAL FIXATION (ORIF) ANKLE WITH SYNDESMOSIS FIXATION;  Surgeon: [name], MD;  Location: [place];  Service: Orthopedics;  Laterality: Left;  90 MINS  ? SALPINGOOPHORECTOMY Bilateral 12/05/2016  ? Procedure: SALPINGO OOPHORECTOMY;  Surgeon: Florian Buff, MD;  Location: [facility];  Service: Gynecology;  Laterality: Bilateral;  ? ?Family History:  ?Family History  ?Problem Relation Age of Onset  ? Diabetes Paternal Grandfather   ? Heart disease Paternal Grandmother   ? Heart disease Maternal Grandmother   ?  Cancer Father   ?     lung  ? ?Family Psychiatric  History: See H&P ? ?Social History:  ?Social History  ? ?Substance and Sexual Activity  ?Alcohol Use Yes  ? Alcohol/week: 15.0 standard drinks  ? Types: 15 Cans of beer per week  ? Comment: "I drink 12-15 pks beer everyday for years now"  ?   ?Social History  ? ?Substance and Sexual Activity  ?Drug Use Yes  ? Types: Marijuana  ? Comment: last use x 1 week ago  ?  ?Social History  ? ?Socioeconomic History  ? Marital status: Single  ?  Spouse name: Not on file  ? Number of children: Not on file  ? Years of education: Not on file  ? Highest education level: Not on file  ?Occupational History  ? Not on file  ?Tobacco Use  ? Smoking status: Never  ? Smokeless tobacco: Current  ?  Types: Snuff  ?Substance and Sexual Activity  ? Alcohol use: Yes  ?  Alcohol/week: 15.0 standard drinks  ?  Types: 15 Cans of beer per week  ?  Comment: "I drink 12-15 pks beer everyday for years now"  ? Drug use: Yes  ?  Types: Marijuana  ?  Comment: last use x 1 week ago  ? Sexual activity: Not Currently  ?  Birth control/protection: None  ?Other Topics Concern  ? Not on file  ?Social History Narrative  ? Pt is a 51 y/o caucasian female. Lives with an older gentleman friend. No stable job / income at this time.  ? ?Social Determinants of Health  ? ?Financial Resource Strain: Not on file  ?Food Insecurity: Not on file  ?Transportation Needs: Not on file  ?Physical Activity: Not on file  ?Stress: Not on file  ?Social Connections: Not on file  ? ? ?Sleep: Good ? ?Appetite:  Improving ? ?Current Medications: ?Current Facility-Administered Medications  ?Medication Dose Route Frequency Provider Last Rate Last Admin  ? albuterol (VENTOLIN HFA) 108 (90 Base) MCG/ACT inhaler 2 puff  2 puff Inhalation Q6H PRN Nelda Marseille, Brenlee Koskela E, MD      ? alum & mag hydroxide-simeth (MAALOX/MYLANTA) 200-200-20 MG/5ML suspension 30 mL  30 mL Oral Q4H PRN Ntuen, Kris Hartmann, FNP      ? benzonatate (TESSALON) capsule 100 mg  100  mg Oral TID PRN Harlow Asa, MD      ? docusate sodium (COLACE) capsule 100 mg  100 mg Oral Daily PRN Nelda Marseille, Draya Felker E, MD      ? gabapentin (NEURONTIN) capsule 100 mg  100 mg Oral BID Rosezetta Schlatter, MD   100 mg at 12/20/21 1644  ? guaiFENesin (MUCINEX) 12 hr tablet 600 mg  600 mg Oral BID PRN Lindon Romp A, NP   600 mg at 12/19/21 2118  ? hydrOXYzine (ATARAX) tablet 25 mg  25 mg Oral Q6H PRN Janine Limbo, MD   25 mg at 12/19/21 2118  ? ibuprofen (ADVIL) tablet 400 mg  400 mg Oral Q6H PRN Janine Limbo, MD   400 mg at 12/20/21 0246  ? loperamide (IMODIUM) capsule  2-4 mg  2-4 mg Oral PRN Massengill, Ovid Curd, MD      ? loratadine (CLARITIN) tablet 10 mg  10 mg Oral Daily PRN Lindon Romp A, NP   10 mg at 12/20/21 0755  ? LORazepam (ATIVAN) tablet 1 mg  1 mg Oral Q6H PRN Janine Limbo, MD   1 mg at 12/19/21 0905  ? LORazepam (ATIVAN) tablet 1 mg  1 mg Oral Daily Massengill, Nathan, MD      ? magnesium hydroxide (MILK OF MAGNESIA) suspension 30 mL  30 mL Oral Daily PRN Ntuen, Kris Hartmann, FNP      ? menthol-cetylpyridinium (CEPACOL) lozenge 3 mg  1 lozenge Oral PRN Lindon Romp A, NP   3 mg at 12/19/21 2118  ? multivitamin with minerals tablet 1 tablet  1 tablet Oral Daily Massengill, Nathan, MD   1 tablet at 12/20/21 7681  ? nicotine (NICODERM CQ - dosed in mg/24 hours) patch 21 mg  21 mg Transdermal Daily Massengill, Ovid Curd, MD   21 mg at 12/19/21 0904  ? ondansetron (ZOFRAN-ODT) disintegrating tablet 4 mg  4 mg Oral Q6H PRN Massengill, Nathan, MD      ? pantoprazole (PROTONIX) EC tablet 40 mg  40 mg Oral Daily Harlow Asa, MD   40 mg at 12/20/21 0751  ? polyethylene glycol (MIRALAX / GLYCOLAX) packet 17 g  17 g Oral Daily Rosezetta Schlatter, MD      ? senna (SENOKOT) tablet 8.6 mg  1 tablet Oral QHS Rosezetta Schlatter, MD   8.6 mg at 12/20/21 2106  ? sertraline (ZOLOFT) tablet 50 mg  50 mg Oral Daily Rosezetta Schlatter, MD      ? thiamine tablet 100 mg  100 mg Oral Daily Massengill, Ovid Curd, MD   100 mg  at 12/20/21 0751  ? traZODone (DESYREL) tablet 50 mg  50 mg Oral QHS PRN Janine Limbo, MD   50 mg at 12/20/21 2105  ? ? ?Lab Results:  ?No results found for this or any previous visit (from the pas

## 2021-12-21 NOTE — Progress Notes (Signed)
?   12/20/21 2105  ?Psych Admission Type (Psych Patients Only)  ?Admission Status Voluntary  ?Psychosocial Assessment  ?Patient Complaints Anxiety  ?Eye Contact Fair  ?Facial Expression Flat  ?Affect Flat  ?Speech Logical/coherent  ?Interaction Assertive  ?Motor Activity Other (Comment) ?(WDL)  ?Appearance/Hygiene Unremarkable  ?Behavior Characteristics Cooperative  ?Mood Anxious  ?Thought Process  ?Coherency WDL  ?Content WDL  ?Delusions None reported or observed  ?Perception WDL  ?Hallucination None reported or observed  ?Judgment Poor  ?Confusion None  ?Danger to Self  ?Current suicidal ideation? Denies  ?Danger to Others  ?Danger to Others None reported or observed  ? ? ?

## 2021-12-22 NOTE — BHH Counselor (Addendum)
CSW provided the Pt with the phone number to [facility] to call and complete an intake assessment.  CSW explained to the Pt that they will need to call daily after there intake to inquire about bed availability.  CSW will follow up with the Pt about that intake and to see if there are any available beds at the facility.   ? ? ?Update: CSW spoke with [facility] and was informed that there are no available beds at this time and that they have a waiting list.  CSW will inform the Pt and providers and will speak with the Pt about outpatient substance use treatment at [facility].  ?

## 2021-12-22 NOTE — Plan of Care (Signed)
?  Problem: Coping: Goal: Ability to verbalize frustrations and anger appropriately will improve Outcome: Progressing Goal: Ability to demonstrate self-control will improve Outcome: Progressing   Problem: Safety: Goal: Periods of time without injury will increase Outcome: Progressing   

## 2021-12-22 NOTE — Progress Notes (Signed)
?   12/22/21 0100  ?Psych Admission Type (Psych Patients Only)  ?Admission Status Voluntary  ?Psychosocial Assessment  ?Patient Complaints Anxiety  ?Eye Contact Fair  ?Facial Expression Sad;Flat  ?Affect Anxious;Sad  ?Speech Logical/coherent  ?Interaction Assertive  ?Motor Activity Other (Comment) ?(WDL)  ?Appearance/Hygiene Unremarkable  ?Behavior Characteristics Cooperative;Appropriate to situation  ?Mood Anxious;Pleasant  ?Thought Process  ?Coherency WDL  ?Content WDL  ?Delusions None reported or observed  ?Perception WDL  ?Hallucination None reported or observed  ?Judgment Impaired  ?Confusion None  ?Danger to Self  ?Current suicidal ideation? Denies  ?Danger to Others  ?Danger to Others None reported or observed  ? ? ?

## 2021-12-22 NOTE — Progress Notes (Signed)
?   12/22/21 5825  ?Psych Admission Type (Psych Patients Only)  ?Admission Status Voluntary  ?Psychosocial Assessment  ?Patient Complaints Anxiety  ?Eye Contact Fair  ?Facial Expression Flat;Sad  ?Affect Sad;Flat  ?Speech Logical/coherent  ?Interaction Assertive;Other (Comment) ?(WNL)  ?Motor Activity Other (Comment) ?(Steady)  ?Appearance/Hygiene Unremarkable  ?Behavior Characteristics Cooperative;Appropriate to situation  ?Mood Anxious;Pleasant  ?Thought Process  ?Coherency WDL  ?Content WDL  ?Delusions None reported or observed  ?Perception WDL  ?Hallucination None reported or observed  ?Judgment Impaired  ?Confusion None  ?Danger to Self  ?Current suicidal ideation? Denies  ?Danger to Others  ?Danger to Others None reported or observed  ? ? ?

## 2021-12-22 NOTE — Group Note (Signed)
Recreation Therapy Group Note ? ? ?Group Topic:Stress Management  ?Group Date: 12/22/2021 ?Start Time: 9562 ?End Time: 0950 ?Facilitators: Victorino Sparrow, LRT,CTRS ?Location: [place] ? ? ?Goal Area(s) Addresses:  ?Patient will actively participate in stress management techniques presented during session.  ?Patient will successfully identify benefit of practicing stress management post d/c.  ?  ?Group Description:  Guided Imagery. LRT provided education, instruction, and demonstration on practice of visualization via guided imagery. Patient was asked to participate in the technique introduced during session. LRT debriefed including topics of mindfulness, stress management and specific scenarios each patient could use these techniques. Patients were given suggestions of ways to access scripts post d/c and encouraged to explore Youtube and other apps available on smartphones, tablets, and computers. ? ? ?Affect/Mood: Appropriate ?  ?Participation Level: Engaged ?  ?Participation Quality: Independent ?  ?Behavior: Appropriate ?  ?Speech/Thought Process: Focused ?  ?Insight: Good ?  ?Judgement: Good ?  ?Modes of Intervention: Script, Petra Kuba Sounds ?  ?Patient Response to Interventions:  Engaged ?  ?Education Outcome: ? Acknowledges education and In group clarification offered   ? ?Clinical Observations/Individualized Feedback: Pt actively participated in group.  Pt expressed no concerns at conclusion of group.  ? ? ?Plan: Continue to engage patient in RT group sessions 2-3x/week. ? ? ?Victorino Sparrow, LRT,CTRS ?12/22/2021 11:54 AM ?

## 2021-12-22 NOTE — Progress Notes (Signed)
Approximately an hour ago, the patient was coughing and talking in her sleep. This occurred on multiple occassions.  ?

## 2021-12-22 NOTE — Progress Notes (Signed)
The patient was overheard moaning in her bedroom. When checked on by this Pryor Curia she claimed that their was another person in her bedroom.  ?

## 2021-12-22 NOTE — Group Note (Signed)
[facility] LCSW Group Therapy Note ? ? ?Group Date: 12/22/2021 ?Start Time: 1300 ?End Time: 1400 ? ? ?Type of Therapy/Topic:  Group Therapy:  Emotion Regulation ? ?Participation Level:  Active  ? ?Mood: Excited  ? ?Description of Group:   ? The purpose of this group is to assist patients in learning to regulate negative emotions and experience positive emotions. Patients will be guided to discuss ways in which they have been vulnerable to their negative emotions. These vulnerabilities will be juxtaposed with experiences of positive emotions or situations, and patients challenged to use positive emotions to combat negative ones. Special emphasis will be placed on coping with negative emotions in conflict situations, and patients will process healthy conflict resolution skills. ? ?Therapeutic Goals: ?Patient will identify two positive emotions or experiences to reflect on in order to balance out negative emotions:  ?Patient will label two or more emotions that they find the most difficult to experience:  ?Patient will be able to demonstrate positive conflict resolution skills through discussion or role plays:  ? ?Summary of Patient Progress:  The Pt attended group and remained there the entire time.  The Pt accepted all handouts and worksheets that were provided.  The Pt participated in the discussion, asked questions, and was appropriate with peers.  The Pt demonstrated knowledge of the topic being discussed and was able to elaborate on how this topic could be helpful to their recovery. ? ? ?Therapeutic Modalities:   ?Cognitive Behavioral Therapy ?Feelings Identification ?Dialectical Behavioral Therapy ? ? ?Darleen Crocker, LCSWA ?

## 2021-12-22 NOTE — BHH Counselor (Signed)
CSW spoke with the Pt who states that the female friend she has been living with does not have a phone.  She continues to decline consents for this friend and any other family or social contact person.  The Pt states that at discharge she will be returning to the female friend's home in [place] if she is unable to get into a residential treatment facility.  ?

## 2021-12-22 NOTE — Progress Notes (Signed)
[facility] MD Progress Note ? ?12/22/2021 7:28 AM ?Mallory Neal  ?MRN:  [id number] ? ?Chief Complaint: SI and HI; alcohol abuse; depression ? ?Reason for Admission: This is a 51 year old female with no reported psychiatric diagnoses but alcohol use disorder-severe/dependent who [facility] from [facility] with SI/HI after a break-up.  This is her first inpatient psychiatric admission.The patient is currently on Hospital Day 3.  ? ?Chart Review from last 24 hours:  ?The patient's chart was reviewed and nursing notes were reviewed. The patient's case was discussed in multidisciplinary team meeting. Per nursing, the patient was more interactive yesterday on the unit and had no acute safety or behavioral concerns. Per MHT the patient was coughing and talking in her sleep overnight and at one point she thought someone was in her room. Per SW she has been denied at [facility] due to Medicaid status and has [facility] application but [facility] cannot take clients until 01/11/22. Per MAR she was compliant with scheduled medications except for Miralax and required Trazodone X1 for sleep. ? ?Information Obtained Today During Patient Interview: ?The patient was seen and evaluated on the unit.  She states that her mood is "pretty good" today and she denies SI or HI.  She has been interacting with peers and states that she feels better after being more social on the unit.  She has been attending groups.  She states she is eating well and her constipation is resolved.  She would like to continue Colace but discontinue Senokot.  When discussing the report  that she thought someone was in her room this morning, she states that this occurred but it was while she was waking from sleep and she thinks she was still in a dream state.  She is able to reality test and denies any true AVH, ideas of reference, paranoia or first rank symptoms.  She denies any signs of acute alcohol withdrawal or cravings but states she has cravings for nicotine/snuff.  She  states her sleep quality was not great and she realizes that she was tossing and turning and talking in her sleep which is causing her to "feel more lazy" today.  We discussed potential titration up on her trazodone.  She does feel her anxiety is improved and she voices no other physical complaints other than residual cough.  She was encouraged to use the as needed medications for cough and was reassured that her chest x-ray was negative.  She understands that with her insurance, she does not currently qualify for residential rehab and states that she is open to substance abuse counseling after discharge.  She plans to return to live with the elderly gentleman she had been staying with prior to admission.  She states that he does not have a phone and there is no way to do safety planning with him.  She would like to potentially discharge this weekend and can articulate a safety plan. ? ?Principal Problem: Depression with suicidal ideation ?Diagnosis: Active Problems: ?  MDD (major depressive episode), single episode, severe, no psychosis ([place]) ?  Alcohol abuse ?  Cocaine abuse ([place]) ?  Cannabis abuse ? ?Total Time Spent in Direct Patient Care:  ?I personally spent 25 minutes on the unit in direct patient care. The direct patient care time included face-to-face time with the patient, reviewing the patient's chart, communicating with other professionals, and coordinating care. Greater than 50% of this time was spent in counseling or coordinating care with the patient regarding goals of hospitalization, psycho-education, and  discharge planning needs. ? ? ?Past Psychiatric History: See H&P ? ?Past Medical History:  ?Past Medical History:  ?Diagnosis Date  ? Anemia   ? Arthritis   ? Chest pain   ? Heart murmur   ?  ?Past Surgical History:  ?Procedure Laterality Date  ? ABDOMINAL HYSTERECTOMY N/A 12/05/2016  ? Procedure: HYSTERECTOMY ABDOMINAL;  Surgeon: Florian Buff, MD;  Location[facility];  Service: Gynecology;   Laterality: N/A;  ? CHOLECYSTECTOMY    ? ORIF ANKLE FRACTURE Left 03/19/2019  ? Procedure: OPEN REDUCTION INTERNAL FIXATION (ORIF) ANKLE WITH SYNDESMOSIS FIXATION;  Surgeon: [name], MD;  Location: [facility];  Service: Orthopedics;  Laterality: Left;  90 MINS  ? SALPINGOOPHORECTOMY Bilateral 12/05/2016  ? Procedure: SALPINGO OOPHORECTOMY;  Surgeon: Florian Buff, MD;  Location: [facility];  Service: Gynecology;  Laterality: Bilateral;  ? ?Family History:  ?Family History  ?Problem Relation Age of Onset  ? Diabetes Paternal Grandfather   ? Heart disease Paternal Grandmother   ? Heart disease Maternal Grandmother   ? Cancer Father   ?     lung  ? ?Family Psychiatric  History: See H&P ? ?Social History:  ?Social History  ? ?Substance and Sexual Activity  ?Alcohol Use Yes  ? Alcohol/week: 15.0 standard drinks  ? Types: 15 Cans of beer per week  ? Comment: "I drink 12-15 pks beer everyday for years now"  ?   ?Social History  ? ?Substance and Sexual Activity  ?Drug Use Yes  ? Types: Marijuana  ? Comment: last use x 1 week ago  ?  ?Social History  ? ?Socioeconomic History  ? Marital status: Single  ?  Spouse name: Not on file  ? Number of children: Not on file  ? Years of education: Not on file  ? Highest education level: Not on file  ?Occupational History  ? Not on file  ?Tobacco Use  ? Smoking status: Never  ? Smokeless tobacco: Current  ?  Types: Snuff  ?Substance and Sexual Activity  ? Alcohol use: Yes  ?  Alcohol/week: 15.0 standard drinks  ?  Types: 15 Cans of beer per week  ?  Comment: "I drink 12-15 pks beer everyday for years now"  ? Drug use: Yes  ?  Types: Marijuana  ?  Comment: last use x 1 week ago  ? Sexual activity: Not Currently  ?  Birth control/protection: None  ?Other Topics Concern  ? Not on file  ?Social History Narrative  ? Pt is a 51 y/o caucasian female. Lives with an older gentleman friend. No stable job / income at this time.  ? ?Social Determinants of Health  ? ?Financial Resource Strain: Not  on file  ?Food Insecurity: Not on file  ?Transportation Needs: Not on file  ?Physical Activity: Not on file  ?Stress: Not on file  ?Social Connections: Not on file  ? ? ?Sleep: Fair ? ?Appetite: Good ? ?Current Medications: ?Current Facility-Administered Medications  ?Medication Dose Route Frequency Provider Last Rate Last Admin  ? albuterol (VENTOLIN HFA) 108 (90 Base) MCG/ACT inhaler 2 puff  2 puff Inhalation Q6H PRN Nelda Marseille, Kit Mollett E, MD      ? alum & mag hydroxide-simeth (MAALOX/MYLANTA) 200-200-20 MG/5ML suspension 30 mL  30 mL Oral Q4H PRN Ntuen, Kris Hartmann, FNP      ? benzonatate (TESSALON) capsule 100 mg  100 mg Oral TID PRN Harlow Asa, MD      ? docusate sodium (COLACE) capsule 100 mg  100 mg Oral Daily PRN Harlow Asa, MD      ? gabapentin (NEURONTIN) capsule 100 mg  100 mg Oral TID Harlow Asa, MD   100 mg at 12/21/21 1622  ? guaiFENesin (MUCINEX) 12 hr tablet 600 mg  600 mg Oral BID PRN Lindon Romp A, NP   600 mg at 12/19/21 2118  ? ibuprofen (ADVIL) tablet 400 mg  400 mg Oral Q6H PRN Janine Limbo, MD   400 mg at 12/20/21 0246  ? loratadine (CLARITIN) tablet 10 mg  10 mg Oral Daily PRN Lindon Romp A, NP   10 mg at 12/20/21 0755  ? magnesium hydroxide (MILK OF MAGNESIA) suspension 30 mL  30 mL Oral Daily PRN Ntuen, Kris Hartmann, FNP      ? menthol-cetylpyridinium (CEPACOL) lozenge 3 mg  1 lozenge Oral PRN Lindon Romp A, NP   3 mg at 12/19/21 2118  ? multivitamin with minerals tablet 1 tablet  1 tablet Oral Daily Massengill, Nathan, MD   1 tablet at 12/21/21 0751  ? nicotine (NICODERM CQ - dosed in mg/24 hours) patch 21 mg  21 mg Transdermal Daily Massengill, Ovid Curd, MD   21 mg at 12/21/21 0751  ? pantoprazole (PROTONIX) EC tablet 40 mg  40 mg Oral Daily Harlow Asa, MD   40 mg at 12/21/21 0751  ? polyethylene glycol (MIRALAX / GLYCOLAX) packet 17 g  17 g Oral Daily Rosezetta Schlatter, MD      ? senna (SENOKOT) tablet 8.6 mg  1 tablet Oral QHS Rosezetta Schlatter, MD   8.6 mg at 12/21/21 2109   ? sertraline (ZOLOFT) tablet 50 mg  50 mg Oral Daily Rosezetta Schlatter, MD   50 mg at 12/21/21 0751  ? thiamine tablet 100 mg  100 mg Oral Daily Massengill, Ovid Curd, MD   100 mg at 12/21/21 0751  ? traZODone (DESYRE

## 2021-12-23 DIAGNOSIS — R45851 Suicidal ideations: Secondary | ICD-10-CM

## 2021-12-23 DIAGNOSIS — F32A Depression, unspecified: Secondary | ICD-10-CM

## 2021-12-23 MED ORDER — TRAZODONE HCL 50 MG PO TABS: 50.0000 mg | ORAL_TABLET | Freq: Every evening | ORAL | 0 refills | Status: AC | PRN

## 2021-12-23 MED ORDER — SERTRALINE HCL 50 MG PO TABS: 50.0000 mg | ORAL_TABLET | Freq: Every day | ORAL | 0 refills | Status: AC

## 2021-12-23 MED ORDER — GABAPENTIN 100 MG PO CAPS: 100.0000 mg | ORAL_CAPSULE | Freq: Three times a day (TID) | ORAL | 0 refills | Status: AC

## 2021-12-23 MED ORDER — PANTOPRAZOLE SODIUM 40 MG PO TBEC: 40.0000 mg | DELAYED_RELEASE_TABLET | Freq: Every day | ORAL | 0 refills | Status: AC

## 2021-12-23 NOTE — Progress Notes (Signed)
D- Patient alert and oriented x4. Patient in stable mood.  Denies SI, HI, AVH. Pt interacting positively with staff and peers. ?  ?A- Scheduled medications administered to patient, per MD orders.Routine safety checks conducted every 15 minutes.  Patient informed to notify staff with problems or concerns. ?  ?R- Patient compliant with medications and verbalized understanding treatment plan. Patient receptive, calm, and cooperative.   ? ? 12/22/21 2000  ?Psych Admission Type (Psych Patients Only)  ?Admission Status Voluntary  ?Psychosocial Assessment  ?Patient Complaints Anxiety  ?Eye Contact Fair  ?Facial Expression Flat  ?Affect Flat  ?Speech Logical/coherent  ?Interaction Assertive  ?Motor Activity Other (Comment) ?(WDL)  ?Appearance/Hygiene Unremarkable  ?Behavior Characteristics Cooperative;Appropriate to situation  ?Mood Anxious  ?Thought Process  ?Coherency WDL  ?Content WDL  ?Delusions None reported or observed  ?Perception WDL  ?Hallucination None reported or observed  ?Judgment Poor  ?Confusion None  ?Danger to Self  ?Current suicidal ideation? Denies  ?Danger to Others  ?Danger to Others None reported or observed  ? ? ?

## 2021-12-23 NOTE — Progress Notes (Signed)
?  [facility] Adult Case Management Discharge Plan : ? ?Will you be returning to the same living situation after discharge:  Yes,  with elderly friend ?At discharge, do you have transportation home?: No.  Needs assistance ?Do you have the ability to pay for your medications: No. ? ?Release of information consent forms completed and emailed to Medical Records, then turned in to Medical Records by CSW.  ? ?Patient to Follow up at: ? Follow-up Information   ? ? [facility] Follow up.   ?Specialty: Behavioral Health ?Why: You may also go to this provider for therapy and medication management services on walk in days:  Mondays and Wednesdays;  Arrive at 7:30 am.  Services are provided on a first come, first served basis. ?Contact information: ?[address] ?[place] [address] ?[phone] ? ?  ?  ? ? [facility] Follow up.   ?Why: A referral has been sent to this facility for you.  Please call to inquire about available beds each day. ?Contact information: ?[address] ?[place] [address] ?[phone] ? ? ?  ?  ? ? [facility] Go to.   ?Specialty: Professional Counselor ?Why: You may go to this provider for therapy and medication management services during walk in hours for new patients:  Monday through Friday, from 9:00 am to 1:00 pm. ?Contact information: ?[address] ?[place] [address] ?[phone] ? ? ?  ?  ? ? Services, [facility] Follow up on 12/29/2021.   ?Why: You are scheduled for an appointment on 12/29/21 at 8:00am for an intake to begin therapy and medication management services.  Please ask about outpatient substance use services while at this appointment.  Please bring your ID, Social [employer], and any proof of Income. ?Contact information: ?[address] ?[place] [address] ?[phone] ? ? ?  ?  ? ?  ?  ? ?  ? ? ?Next level of care provider has access to [facility] ? ?Safety Planning and Suicide Prevention discussed: No.  Patient declined ? ?  ? ?Has patient been referred to the Quitline?: Patient refused referral ? ?Patient has been referred for addiction treatment: Yes ? ?Maretta Los, LCSW ?12/23/2021, 10:11 AM ?

## 2021-12-23 NOTE — BHH Suicide Risk Assessment (Addendum)
Suicide Risk Assessment ? ?Discharge Assessment    ?[facility] Discharge Suicide Risk Assessment ? ? ?Principal Problem: Depression with suicidal ideation ?Discharge Diagnoses: Active Problems: ?  MDD (major depressive episode), single episode, severe, no psychosis ([place]) ?  Alcohol abuse ?  Cocaine abuse ([place]) ?  Cannabis abuse ? ?Hospital Course:  Hospital Summary ? ?Brief History: Mallory Neal is a 51y/o female with no known past psychiatric diagnosis who was admitted voluntarily for management of SI and HI in the context of recent breakup with her girlfriend. She states that if she had had access to a gun she would have shot herself. She has had HI thoughts toward her ex and her ex-girlfriend's new partner but denies intent or plan to harm them. She states that since Easter she has been having conflict with the ex-girlfriend with worsening anhedonia, poor sleep, depressed mood, low energy and poor focus. She denies h/o previous psychotropic med trials, inpatient admissions, or past suicide attempts. She does drink daily alcohol 15 beers/day and moonshine if she has it for the last several years and admits to current withdrawal symptoms. She reports using THC 1/2 blunt every other day and smoking crack 1-2 times/week. She denies h/o mania or psychosis/delusions/paranoia ?During the patient's hospitalization, patient had extensive initial psychiatric evaluation, and follow-up psychiatric evaluations every day. ?  ?Psychiatric diagnoses provided upon initial assessment:  ? Active Problems: ?  MDD (major depressive episode), single episode, severe, no psychosis ([place]) ?  Alcohol abuse ?  Cocaine abuse ([place]) ?  Cannabis abuse ? ?Patient's psychiatric medications were adjusted on admission:  ?Zoloft 25 mg was increased to 50 mg since LFT was WNL. ? ?Discharged medications Include: ?Depression with suicidal ideation ?-- Start Zoloft 50 mg (LFTs largely WBL) ?  ?#Alcohol use disorder ?--Gabapentin 100 mg BID for alcohol  cravings; considered Naltrexone but patient with Vicodin on home medications and concerns that patient recently took opioid medications ?  ?#Nicotine Use Disorder ?-Nicotine patch 21 mg daily, patient refused on discharge ? ?#Acid Reflux: ?-Protonix EC tablet 40 mg po daily ? ?#Insomnia: ?Trazodone 50 mg po QHS prn ?  ?-Continue to monitor for medication side effects and adjust as needed. ? ?-Encouraged patient to continue with daily affirmation and reflection exercises. ?  ?During the hospitalization, other adjustments were made to the patient's psychiatric medication regimen:  ? -Senokot and Miralax were D/C and started on Colace PRN for constipation ?  ?Patient's care was discussed during the interdisciplinary team meeting every day during the hospitalization. ?  ?The patient denied having side effects to prescribed psychiatric medication. ?  ?The patient reports their target psychiatric symptoms of Alcohol craving / depression responded well to the psychiatric medications, and the patient reports overall benefit other psychiatric hospitalization. Supportive psychotherapy was provided to the patient. The patient also participated in regular group therapy while admitted.  ?  ?Labs were reviewed with the patient, and abnormal results were discussed with the patient. ?  ?The patient denied having suicidal thoughts more than 48 hours prior to discharge.  Patient denies having homicidal thoughts.  Patient denies having auditory hallucinations.  Patient denies any visual hallucinations.  Patient denies having paranoid thoughts. ?  ?The patient is able to verbalize their individual safety plan to this provider. ?  ?It is recommended to the patient to continue psychiatric medications as prescribed, after discharge from the hospital.   ?  ?It is recommended to the patient to follow up with your outpatient psychiatric provider and PCP. ?  ?  Discussed with the patient, the impact of alcohol, drugs, tobacco have been there  overall psychiatric and medical wellbeing, and total abstinence from substance use was recommended the patient. ?  ?Total Time spent with patient: 1 hour ? ?Musculoskeletal: ?Strength & Muscle Tone: within normal limits ?Gait & Station: normal ?Patient leans: N/A ? ?Psychiatric Specialty Exam ? ?Presentation  ?General Appearance: Appropriate for Environment; Casual ? ?Eye Contact:Fair ? ?Speech:Clear and Coherent; Normal Rate ? ?Speech Volume:Normal ? ?Handedness:No data recorded ? ?Mood and Affect  ?Mood:Depressed (less depressed) ? ?Duration of Depression Symptoms: Less than two weeks ? ?Affect:Congruent; Depressed ? ? ?Thought Process  ?Thought Processes:Coherent ? ?Descriptions of Associations:Intact ? ?Orientation:Full (Time, Place and Person) ? ?Thought Content:WDL ? ?History of Schizophrenia/Schizoaffective disorder:No ? ?Duration of Psychotic Symptoms:No data recorded ?Hallucinations:No data recorded ?Ideas of Reference:None ? ?Suicidal Thoughts:No data recorded ?Homicidal Thoughts:No data recorded ? ?Sensorium  ?Memory:Immediate Good; Recent Good ? ?Judgment:Intact ? ?Insight:Fair ? ? ?Executive Functions  ?Concentration:Fair ? ?Attention Span:Fair ? ?Recall:Fair ? ?[place] ? ?Language:Good ? ? ?Psychomotor Activity  ?Psychomotor Activity:No data recorded ? ?Assets  ?Assets:Desire for Improvement; Resilience ? ? ?Sleep  ?Sleep:No data recorded ? ?Physical Exam: ?Physical Exam ?Vitals and nursing note reviewed.  ?Constitutional:   ?   Appearance: Normal appearance.  ?HENT:  ?   Head: Normocephalic and atraumatic.  ?   Right Ear: External ear normal.  ?   Left Ear: External ear normal.  ?   Nose: Nose normal.  ?   Mouth/Throat:  ?   Mouth: Mucous membranes are moist.  ?   Pharynx: Oropharynx is clear.  ?Eyes:  ?   Extraocular Movements: Extraocular movements intact.  ?   Conjunctiva/sclera: Conjunctivae normal.  ?   Pupils: Pupils are equal, round, and reactive to light.  ?Cardiovascular:  ?    Rate and Rhythm: Normal rate.  ?   Pulses: Normal pulses.  ?Pulmonary:  ?   Effort: Pulmonary effort is normal.  ?Abdominal:  ?   Palpations: Abdomen is soft.  ?Genitourinary: ?   Comments: deferred ?Musculoskeletal:     ?   General: Normal range of motion.  ?   Cervical back: Normal range of motion and neck supple.  ?Skin: ?   General: Skin is warm.  ?Neurological:  ?   General: No focal deficit present.  ?   Mental Status: She is alert and oriented to person, place, and time.  ?Psychiatric:     ?   Mood and Affect: Mood normal.     ?   Behavior: Behavior normal.     ?   Thought Content: Thought content normal.  ? ?Review of Systems  ?Constitutional: Negative.  Negative for chills and fever.  ?HENT: Negative.  Negative for hearing loss and tinnitus.   ?Eyes: Negative.  Negative for blurred vision and double vision.  ?Respiratory: Negative.  Negative for cough, sputum production, shortness of breath and wheezing.   ?Cardiovascular: Negative.  Negative for chest pain and palpitations.  ?Gastrointestinal: Negative.  Negative for abdominal pain, constipation, diarrhea, heartburn, nausea and vomiting.  ?Genitourinary: Negative.  Negative for dysuria, frequency and urgency.  ?Musculoskeletal: Negative.  Negative for back pain, falls, joint pain, myalgias and neck pain.  ?Skin: Negative.  Negative for itching and rash.  ?Neurological: Negative.  Negative for dizziness, tingling, tremors, sensory change, speech change, focal weakness, seizures, weakness and headaches.  ?Endo/Heme/Allergies: Negative.  Negative for environmental allergies and polydipsia. Does not bruise/bleed easily.  ?Psychiatric/Behavioral:  Positive  for depression (Stable with medications), substance abuse (Denied) and suicidal ideas (Denied). The patient is nervous/anxious (Stable with medications) and has insomnia (Improved with medications).   ?Blood pressure 123/80, pulse 72, temperature 97.9 ?F (36.6 ?C), temperature source Oral, resp. rate 16,  height '5\' 6"'$  (1.676 m), weight 88.7 kg, SpO2 95 %. Body mass index is 31.57 kg/m?. ? ?Mental Status Per Nursing Assessment::   ?On Admission:  Suicidal ideation indicated by patient, Self-harm thoughts, Plan to ha

## 2021-12-23 NOTE — Progress Notes (Signed)
?   12/23/21 0900  ?Psych Admission Type (Psych Patients Only)  ?Admission Status Voluntary  ?Psychosocial Assessment  ?Patient Complaints None  ?Eye Contact Fair  ?Facial Expression Animated  ?Affect Appropriate to circumstance  ?Speech Logical/coherent  ?Interaction Assertive  ?Motor Activity Other (Comment) ?(WDL)  ?Appearance/Hygiene Unremarkable  ?Behavior Characteristics Cooperative;Appropriate to situation  ?Mood Anxious  ?Thought Process  ?Coherency WDL  ?Content WDL  ?Delusions None reported or observed  ?Perception WDL  ?Hallucination None reported or observed  ?Judgment Poor  ?Confusion None  ?Danger to Self  ?Current suicidal ideation? Denies  ?Danger to Others  ?Danger to Others None reported or observed  ? ? ?

## 2021-12-23 NOTE — Progress Notes (Signed)
Patient discharged. Reviewed discharge instructions with patient. Patient verbalized understanding. Patient received all personal belongings. At discharge patient denied SI/HI/AVH. Patient left unit at 1614.  ?

## 2021-12-23 NOTE — Discharge Summary (Addendum)
Physician Discharge Summary Note ? ?Patient:  Mallory Neal is an 51 y.o., female ?MRN:  [id number] ?DOB:  September 06, 1970 ?Patient phone:  [phone] (home)  ?Patient address:   ?Homeless ?[place] [address],  ? ?Total Time spent with patient: 1 hour ? ?Date of Admission:  12/18/2021 ?Date of Discharge:   12/23/21 ? ?Reason for Admission:  Mallory Neal. [name] is a 51y/o female with no known past psychiatric diagnosis who was admitted voluntarily for management of SI and HI in the context of recent breakup with her girlfriend. She states that if she had had access to a gun she would have shot herself. She has had HI thoughts toward her ex and her ex-girlfriend's new partner but denies intent or plan to harm them. She states that since Easter she has been having conflict with the ex-girlfriend with worsening anhedonia, poor sleep, depressed mood, low energy and poor focus. She denies h/o previous psychotropic med trials, inpatient admissions, or past suicide attempts. She does drink daily alcohol 15 beers/day and moonshine if she has it for the last several years and admits to current withdrawal symptoms. She reports using THC 1/2 blunt every other day and smoking crack 1-2 times/week. She denies h/o mania or psychosis/delusions/paranoia ? ?Principal Problem: Depression with suicidal ideation ? ?Discharge Diagnoses: Active Problems: ?  MDD (major depressive episode), single episode, severe, no psychosis ([place]) ?  Alcohol abuse ?  Cocaine abuse ([place]) ?  Cannabis abuse ? ? ?Past Psychiatric History: None ? ?Past Medical History:  ?Past Medical History:  ?Diagnosis Date  ? Anemia   ? Arthritis   ? Chest pain   ? Heart murmur   ?  ?Past Surgical History:  ?Procedure Laterality Date  ? ABDOMINAL HYSTERECTOMY N/A 12/05/2016  ? Procedure: HYSTERECTOMY ABDOMINAL;  Surgeon: Florian Buff, MD;  Location: [facility];  Service: Gynecology;  Laterality: N/A;  ? CHOLECYSTECTOMY    ? ORIF ANKLE FRACTURE Left 03/19/2019  ? Procedure: OPEN REDUCTION  INTERNAL FIXATION (ORIF) ANKLE WITH SYNDESMOSIS FIXATION;  Surgeon: [name], MD;  Location: [facility];  Service: Orthopedics;  Laterality: Left;  90 MINS  ? SALPINGOOPHORECTOMY Bilateral 12/05/2016  ? Procedure: SALPINGO OOPHORECTOMY;  Surgeon: Florian Buff, MD;  Location: [facility];  Service: Gynecology;  Laterality: Bilateral;  ? ?Family History:  ?Family History  ?Problem Relation Age of Onset  ? Diabetes Paternal Grandfather   ? Heart disease Paternal Grandmother   ? Heart disease Maternal Grandmother   ? Cancer Father   ?     lung  ? ?Family Psychiatric  History: none ? ?Social History:  ?Social History  ? ?Substance and Sexual Activity  ?Alcohol Use Yes  ? Alcohol/week: 15.0 standard drinks  ? Types: 15 Cans of beer per week  ? Comment: "I drink 12-15 pks beer everyday for years now"  ?   ?Social History  ? ?Substance and Sexual Activity  ?Drug Use Yes  ? Types: Marijuana  ? Comment: last use x 1 week ago  ?  ?Social History  ? ?Socioeconomic History  ? Marital status: Single  ?  Spouse name: Not on file  ? Number of children: Not on file  ? Years of education: Not on file  ? Highest education level: Not on file  ?Occupational History  ? Not on file  ?Tobacco Use  ? Smoking status: Never  ? Smokeless tobacco: Current  ?  Types: Snuff  ?Substance and Sexual Activity  ? Alcohol use: Yes  ?  Alcohol/week: 15.0 standard drinks  ?  Types: 15 Cans of beer per week  ?  Comment: "I drink 12-15 pks beer everyday for years now"  ? Drug use: Yes  ?  Types: Marijuana  ?  Comment: last use x 1 week ago  ? Sexual activity: Not Currently  ?  Birth control/protection: None  ?Other Topics Concern  ? Not on file  ?Social History Narrative  ? Pt is a 51 y/o caucasian female. Lives with an older gentleman friend. No stable job / income at this time.  ? ?Social Determinants of Health  ? ?Financial Resource Strain: Not on file  ?Food Insecurity: Not on file  ?Transportation Needs: Not on file  ?Physical Activity: Not on file   ?Stress: Not on file  ?Social Connections: Not on file  ? ? ?Hospital Course:  Brief History: Mallory Neal. Mallory Neal is a 51y/o female with no known past psychiatric diagnosis who was admitted voluntarily for management of SI and HI in the context of recent breakup with her girlfriend. She states that if she had had access to a gun she would have shot herself. She has had HI thoughts toward her ex and her ex-girlfriend's new partner but denies intent or plan to harm them. She states that since Easter she has been having conflict with the ex-girlfriend with worsening anhedonia, poor sleep, depressed mood, low energy and poor focus. She denies h/o previous psychotropic med trials, inpatient admissions, or past suicide attempts. She does drink daily alcohol 15 beers/day and moonshine if she has it for the last several years and admits to current withdrawal symptoms. She reports using THC 1/2 blunt every other day and smoking crack 1-2 times/week. She denies h/o mania or psychosis/delusions/paranoia ? ?During the patient's hospitalization, patient had extensive initial psychiatric evaluation, and follow-up psychiatric evaluations every day. ?  ?Psychiatric diagnoses provided upon initial assessment:  ? Active Problems: ?  MDD (major depressive episode), single episode, severe, no psychosis ([place]) ?  Alcohol abuse ?  Cocaine abuse ([place]) ?  Cannabis abuse ?  ?Patient's psychiatric medications were adjusted on admission:  ?Zoloft 25 mg was initiated and titrated up to '50mg'$  prior to discharge. ?Patient was placed on scheduled Ativan taper for alcohol withdrawal which she completed and was monitored with [facility]. ?  ?Discharged medications Include: ?#Depression with suicidal ideation ?-- Zoloft 50 mg  ?  ?#Alcohol use disorder ?#Stimulant use d/o ?#Cannabis use d/o ?--Gabapentin 100 mg BID for alcohol cravings ?  ?#Nicotine Use Disorder ?-Nicotine patch 21 mg daily, patient refused on discharge ?  ?#Acid Reflux: ?-Protonix EC tablet 40  mg po daily ?  ?#Insomnia: ?Trazodone 50 mg po QHS prn ? ? #Constipation:  ? -Senokot and Miralax were D/C and started on Colace PRN for constipation ? ?-Continue to monitor for medication side effects and adjust as needed. ?  ?-Encouraged patient to continue with daily affirmation and reflection exercises. ?  ?Patient's care was discussed during the interdisciplinary team meeting every day during the hospitalization. ?  ?The patient denied having side effects to prescribed psychiatric medication. ?  ?The patient reports their target psychiatric symptoms of Depression / Alcohol craving responded well to the psychiatric medications, and the patient reports overall benefit other psychiatric hospitalization. Supportive psychotherapy was provided to the patient. The patient also participated in regular group therapy while admitted.  ?  ?Labs were reviewed with the patient, and abnormal results were discussed with the patient.She was advised to see PCP after discharge for  monitoring of mild LFT elevation felt likely to be secondary to alcohol use. Her acute hepatitis panel was nonreactive. During admission she had a cough and had negative CXR. ?  ?The patient denied having suicidal thoughts more than 48 hours prior to discharge.  Patient denies having homicidal thoughts.  Patient denies having auditory hallucinations.  Patient denies any visual hallucinations.  Patient denies having paranoid thoughts. ?  ?The patient is able to verbalize their individual safety plan to this provider. ?  ?It is recommended to the patient to continue psychiatric medications as prescribed, after discharge from the hospital.   ?  ?It is recommended to the patient to follow up with your outpatient psychiatric provider and PCP. ?  ?Discussed with the patient, the impact of alcohol, drugs, tobacco have been there overall psychiatric and medical wellbeing, and total abstinence from substance use was recommended the patient. ?  ? ?Physical  Findings: ?AIMS: Facial and Oral Movements ?Muscles of Facial Expression: None, normal ?Lips and Perioral Area: None, normal ?Jaw: None, normal ?Tongue: None, normal,Extremity Movements ?Upper (arms, wrists, h

## 2021-12-23 NOTE — BHH Group Notes (Signed)
.  Psychoeducational Group Note ? ? ? ?Date:  5/13//23 ?Time: 1300-1400 ? ? ? ?Purpose of Group: . The group focus' on teaching patients on how to identify their needs and their Life Skills:  A group where two lists are made. What people need and what are things that we do that are unhealthy. The lists are developed by the patients and it is explained that we often do the actions that are not healthy to get our list of needs met. ? ?Goal:: to develop the coping skills needed to get their needs met ? ?Participation Level:  Active ? ?Participation Quality:  Appropriate ? ?Affect:  Appropriate ? ?Cognitive:  Oriented ? ?Insight:  Improving ? ?Engagement in Group:  Engaged ? ?Additional Comments: Rates energy at  a 7/10.  Participated fully in the group. ? ?Bryson Dames A ?

## 2021-12-23 NOTE — Plan of Care (Signed)
?  Problem: Education: ?Goal: Knowledge of [facility] Education information/materials will improve ?Outcome: Adequate for Discharge ?Goal: Emotional status will improve ?Outcome: Adequate for Discharge ?Goal: Mental status will improve ?Outcome: Adequate for Discharge ?Goal: Verbalization of understanding the information provided will improve ?Outcome: Adequate for Discharge ?  ?Problem: Activity: ?Goal: Interest or engagement in activities will improve ?Outcome: Adequate for Discharge ?Goal: Sleeping patterns will improve ?Outcome: Adequate for Discharge ?  ?Problem: Coping: ?Goal: Ability to verbalize frustrations and anger appropriately will improve ?Outcome: Adequate for Discharge ?Goal: Ability to demonstrate self-control will improve ?Outcome: Adequate for Discharge ?  ?Problem: Health Behavior/Discharge Planning: ?Goal: Identification of resources available to assist in meeting health care needs will improve ?Outcome: Adequate for Discharge ?Goal: Compliance with treatment plan for underlying cause of condition will improve ?Outcome: Adequate for Discharge ?  ?Problem: Physical Regulation: ?Goal: Ability to maintain clinical measurements within normal limits will improve ?Outcome: Adequate for Discharge ?  ?Problem: Safety: ?Goal: Periods of time without injury will increase ?Outcome: Adequate for Discharge ?  ?Problem: Education: ?Goal: Ability to make informed decisions regarding treatment will improve ?Outcome: Adequate for Discharge ?  ?Problem: Coping: ?Goal: Coping ability will improve ?Outcome: Adequate for Discharge ?  ?Problem: Health Behavior/Discharge Planning: ?Goal: Identification of resources available to assist in meeting health care needs will improve ?Outcome: Adequate for Discharge ?  ?Problem: Medication: ?Goal: Compliance with prescribed medication regimen will improve ?Outcome: Adequate for Discharge ?  ?Problem: Education: ?Goal: Utilization of techniques to improve thought  processes will improve ?Outcome: Adequate for Discharge ?Goal: Knowledge of the prescribed therapeutic regimen will improve ?Outcome: Adequate for Discharge ?  ?Problem: Activity: ?Goal: Interest or engagement in leisure activities will improve ?Outcome: Adequate for Discharge ?Goal: Imbalance in normal sleep/wake cycle will improve ?Outcome: Adequate for Discharge ?  ?Problem: Coping: ?Goal: Coping ability will improve ?Outcome: Adequate for Discharge ?Goal: Will verbalize feelings ?Outcome: Adequate for Discharge ?  ?Problem: Health Behavior/Discharge Planning: ?Goal: Ability to make decisions will improve ?Outcome: Adequate for Discharge ?Goal: Compliance with therapeutic regimen will improve ?Outcome: Adequate for Discharge ?  ?Problem: Role Relationship: ?Goal: Will demonstrate positive changes in social behaviors and relationships ?Outcome: Adequate for Discharge ?  ?Problem: Safety: ?Goal: Ability to disclose and discuss suicidal ideas will improve ?Outcome: Adequate for Discharge ?Goal: Ability to identify and utilize support systems that promote safety will improve ?Outcome: Adequate for Discharge ?  ?Problem: Self-Concept: ?Goal: Will verbalize positive feelings about self ?Outcome: Adequate for Discharge ?Goal: Level of anxiety will decrease ?Outcome: Adequate for Discharge ?  ?Problem: Activity: ?Goal: Will identify at least one activity in which they can participate ?Outcome: Adequate for Discharge ?  ?Problem: Coping: ?Goal: Ability to identify and develop effective coping behavior will improve ?Outcome: Adequate for Discharge ?Goal: Ability to interact with others will improve ?Outcome: Adequate for Discharge ?Goal: Demonstration of participation in decision-making regarding own care will improve ?Outcome: Adequate for Discharge ?Goal: Ability to use eye contact when communicating with others will improve ?Outcome: Adequate for Discharge ?  ?Problem: Health Behavior/Discharge Planning: ?Goal:  Identification of resources available to assist in meeting health care needs will improve ?Outcome: Adequate for Discharge ?  ?

## 2021-12-23 NOTE — Group Note (Signed)
[facility] Group Notes: (Clinical Social Work)  ? ?12/23/2021    ? ? ?Type of Therapy:  Group Therapy  ? ?Participation Level:  Did Not Attend - was invited both individually by [facility] and by overhead announcement, chose not to attend.  She did arrive for the last 2-3 minutes. ? ? ?Selmer Dominion, LCSW ?12/23/2021, 3:05 PM ?   ?

## 2021-12-23 NOTE — [facility] Group Notes (Signed)
Psychoeducational Group Note ? ?Date: 12/23/2021 ?Time: 0900-1000 ? ? ? ?Goal Setting  ? ?Purpose of Group: This group helps to provide patients with the steps of setting a goal that is specific, measurable, attainable, realistic and time specific. A discussion on how we keep ourselves stuck with negative self talk. Homework given for Patients to write 30 positive attributes about themselves. ? ? ? ?Participation Level:  Active ? ?Participation Quality:  Appropriate ? ?Affect:  Appropriate ? ?Cognitive:  Appropriate ? ?Insight:  Improving ? ?Engagement in Group:  Engaged ? ?Additional Comments:  Pt states that her energy level is a 20/10. States that she is going to be leaving today and is happy about that. Participated in the group. ? ?Bryson Dames A ?

## 2021-12-23 NOTE — Progress Notes (Signed)
Pt verbalized she had difficulty sleeping last night and she thought provider would increase dose of trazodone. Pt given same dose of trazodone as prior night and appeared to be sleeping throughout the night. During safety checks, pt was snoring while sleeping.  ? ? 12/23/21 0526  ?Sleep  ?Number of Hours 7.5  ? ? ?

## 2023-07-06 ENCOUNTER — Emergency Department (HOSPITAL_COMMUNITY): Payer: Medicaid Other

## 2023-07-06 ENCOUNTER — Emergency Department (HOSPITAL_COMMUNITY)
Admission: EM | Admit: 2023-07-06 | Discharge: 2023-07-06 | Disposition: A | Discharge status: Home / Self Care | Payer: Medicaid Other | Attending: Emergency Medicine | Admitting: Emergency Medicine

## 2023-07-06 ENCOUNTER — Encounter (HOSPITAL_COMMUNITY): Payer: Self-pay | Admitting: Emergency Medicine

## 2023-07-06 ENCOUNTER — Other Ambulatory Visit: Payer: Self-pay

## 2023-07-06 DIAGNOSIS — M545 Low back pain, unspecified: Secondary | ICD-10-CM | POA: Insufficient documentation

## 2023-07-06 DIAGNOSIS — W108XXA Fall (on) (from) other stairs and steps, initial encounter: Secondary | ICD-10-CM | POA: Diagnosis not present

## 2023-07-06 DIAGNOSIS — M25551 Pain in right hip: Secondary | ICD-10-CM | POA: Insufficient documentation

## 2023-07-06 DIAGNOSIS — R269 Unspecified abnormalities of gait and mobility: Secondary | ICD-10-CM | POA: Insufficient documentation

## 2023-07-06 DIAGNOSIS — W19XXXA Unspecified fall, initial encounter: Secondary | ICD-10-CM

## 2023-07-06 MED ORDER — LIDOCAINE 5 % EX PTCH
1.0000 | MEDICATED_PATCH | CUTANEOUS | Status: DC
Start: 2023-07-06 — End: 2023-07-06
  Administered 2023-07-06: 1 via TRANSDERMAL
  Filled 2023-07-06: qty 1

## 2023-07-06 MED ORDER — OXYCODONE-ACETAMINOPHEN 5-325 MG PO TABS
1.0000 | ORAL_TABLET | Freq: Once | ORAL | Status: AC
  Administered 2023-07-06: 1 via ORAL
  Filled 2023-07-06: qty 1

## 2023-07-06 MED ORDER — NAPROXEN 375 MG PO TABS: 375.0000 mg | ORAL_TABLET | Freq: Two times a day (BID) | ORAL | 0 refills | Status: AC

## 2023-07-06 MED ORDER — CYCLOBENZAPRINE HCL 10 MG PO TABS: 10.0000 mg | ORAL_TABLET | Freq: Two times a day (BID) | ORAL | 0 refills | Status: AC | PRN

## 2023-07-06 NOTE — ED Triage Notes (Signed)
Pain to RT lower back after missing a step this morning and falling

## 2023-07-06 NOTE — Discharge Instructions (Addendum)
Thank you for letting us evaluate you today.  We controlled your pain in the ED with a lidocaine patch and Percocet.  Your imaging was negative for obvious occult fracture of your lower back or right hip.  Unfortunately, you are probably bruised up and that is what is causing your pain.  I have given you a prescription for naproxen and flexeril to your [facility] pharmacy in [place] if you want to use it (if you do use it you cannot use naproxen with ibuprofen or Aleve).  You cannot use this with alcohol nor operating heavy machinery and I recommend that you take it at night as it can make you drowsy.  Otherwise you can use lidocaine patches, ice/heat pads, lidocaine patches, Voltaren gel, Tylenol all found at the pharmacy.  As you have gotten Percocet in the ED, you cannot take Tylenol for at least another 5 to 6 hours.  Return to emergency department if you experience loss of sensation in genital region, urinary or fecal incontinence, inability to void, numbness tingling of 1 side of your body, bloody or dark stool.

## 2023-07-06 NOTE — ED Notes (Signed)
Pt in XRAY

## 2023-07-06 NOTE — ED Provider Notes (Signed)
[name] EMERGENCY DEPARTMENT AT [name] Provider Note   CSN: [id number] Arrival date & time: 07/06/23  1331     History  Chief Complaint  Patient presents with   Mallory Neal is a 52 y.o. female with no significant past medical history presents to emergency department for evaluation of lower back pain and right hip pain following fall this morning at 0400.  She reports that she missed a step and slipped down one step falling backwards on back and right hip.  She reports that she presents 10 hours following fall due to not having a ride to get to ED but pain has not decreased.  She denies head injury, LOC, blood thinner, paresthesia, urinary or fecal incontinence.   Fall Pertinent negatives include no chest pain, no abdominal pain, no headaches and no shortness of breath.      Home Medications Prior to Admission medications   Medication Sig Start Date End Date Taking? Authorizing Provider  cyclobenzaprine (FLEXERIL) 10 MG tablet Take 1 tablet (10 mg total) by mouth 2 (two) times daily as needed for up to 5 days for muscle spasms. 07/06/23 07/11/23 Yes Mallory Sheen, PA  naproxen (NAPROSYN) 375 MG tablet Take 1 tablet (375 mg total) by mouth 2 (two) times daily. 07/06/23  Yes Mallory Sheen, PA  gabapentin (NEURONTIN) 100 MG capsule Take 1 capsule (100 mg total) by mouth 3 (three) times daily. 12/23/21   Ntuen, Jesusita Oka, FNP  pantoprazole (PROTONIX) 40 MG tablet Take 1 tablet (40 mg total) by mouth daily. 12/24/21   Ntuen, Jesusita Oka, FNP  sertraline (ZOLOFT) 50 MG tablet Take 1 tablet (50 mg total) by mouth daily. 12/24/21   Ntuen, Jesusita Oka, FNP  traZODone (DESYREL) 50 MG tablet Take 1 tablet (50 mg total) by mouth at bedtime as needed for sleep. 12/23/21   Cecilie Lowers, FNP      Allergies    Codeine    Review of Systems   Review of Systems  Constitutional:  Negative for chills, fatigue and fever.  Respiratory:  Negative for cough, chest tightness, shortness  of breath and wheezing.   Cardiovascular:  Negative for chest pain and palpitations.  Gastrointestinal:  Negative for abdominal pain, constipation, diarrhea, nausea and vomiting.  Musculoskeletal:  Positive for arthralgias and back pain.  Neurological:  Negative for dizziness, seizures, weakness, light-headedness, numbness and headaches.    Physical Exam Updated Vital Signs BP 117/66   Pulse 76   Temp 98 F (36.7 C) (Oral)   Resp 20   Ht 5\' 6"  (1.676 m)   Wt 82.6 kg   LMP 10/25/2016   SpO2 97%   BMI 29.38 kg/m  Physical Exam Vitals and nursing note reviewed.  Constitutional:      Appearance: Normal appearance. She is not diaphoretic.     Comments: Difficulty with sitting still secondary to pain  HENT:     Head: Normocephalic and atraumatic.     Comments: No battle sign No hematoma    Right Ear: External ear normal.     Left Ear: External ear normal.     Ears:     Comments: No hemotympanum    Nose: Nose normal.     Comments: No epistaxis nor dried blood in nostrils bilaterally    Mouth/Throat:     Comments: No damage to tongue Eyes:     General:        Right eye: No discharge.  Left eye: No discharge.     Extraocular Movements: Extraocular movements intact.     Conjunctiva/sclera: Conjunctivae normal.     Pupils: Pupils are equal, round, and reactive to light.     Comments: No subconjunctival hemorrhage, hyphema, tear drop pupil, or fluid leakage bilaterally  Neck:     Vascular: No carotid bruit.     Comments: No TTP, step-off, crepitus, deformity, masses palpated to cervical neck Cardiovascular:     Rate and Rhythm: Normal rate.     Pulses: Normal pulses.     Comments: Radial pulses 2+ equal bilaterally Dorsalis pedis 2+ equal bilaterally Pulmonary:     Effort: Pulmonary effort is normal. No respiratory distress.     Breath sounds: Normal breath sounds. No wheezing.  Chest:     Chest wall: No tenderness.  Abdominal:     General: Bowel sounds are  normal. There is no distension.     Palpations: Abdomen is soft.     Tenderness: There is no abdominal tenderness.  Musculoskeletal:     Cervical back: Normal range of motion and neck supple. No rigidity or tenderness.     Comments: TTP to mid spinous processes on lumbar spine and R hip TTP to paraspinous musculature in lumbar region No masses, crepitus, step-off, deformity to thoracic or lumbar spine No obvious deformity to joints or long bones Pelvis stable with no shortening or rotation of LE bilaterally  Skin:    General: Skin is warm and dry.     Capillary Refill: Capillary refill takes less than 2 seconds.     Comments: No ecchymosis, skin changes to back  Neurological:     General: No focal deficit present.     Mental Status: She is alert and oriented to person, place, and time. Mental status is at baseline.     GCS: GCS eye subscore is 4. GCS verbal subscore is 5. GCS motor subscore is 6.     Cranial Nerves: Cranial nerves 2-12 are intact. No cranial nerve deficit.     Sensory: Sensation is intact. No sensory deficit.     Motor: Motor function is intact. No weakness or tremor.     Coordination: Coordination is intact. Coordination normal. Finger-Nose-Finger Test and Heel to [facility] Test normal.     Gait: Gait abnormal (w/ limp 2/2 pain).     Deep Tendon Reflexes: Reflexes are normal and symmetric.     Comments: Acting following commands appropriately    ED Results / Procedures / Treatments   Labs (all labs ordered are listed, but only abnormal results are displayed) Labs Reviewed - No data to display  EKG None  Radiology DG Hip Unilat W or Wo Pelvis 2-3 Views Right  Result Date: 07/06/2023 CLINICAL DATA:  Fall.  Pain over right hip. EXAM: DG HIP (WITH OR WITHOUT PELVIS) 2-3V RIGHT COMPARISON:  None Available. FINDINGS: Pelvis is intact with normal and symmetric sacroiliac joints. No acute fracture or dislocation. No aggressive osseous lesion. Visualized sacral arcuate  lines are unremarkable. Unremarkable symphysis pubis. There are mild degenerative changes of bilateral hip joints without significant joint space narrowing. Osteophytosis of the superior acetabulum. No radiopaque foreign bodies. IMPRESSION: *No acute osseous abnormality of the pelvis or right hip joint. Electronically Signed   By: Jules Schick M.D.   On: 07/06/2023 15:46   DG Lumbar Spine Complete  Result Date: 07/06/2023 CLINICAL DATA:  ttp of lumbar midspinous processes EXAM: LUMBAR SPINE - COMPLETE 4+ VIEW COMPARISON:  None Available. FINDINGS: Limited  evaluation due to overlapping osseous structures and overlying soft tissues. Mild multilevel degenerative changes of the spine. No severe osseous neural foraminal stenosis. There is no evidence of lumbar spine fracture. Alignment is normal. Intervertebral disc spaces are maintained. Mild bilateral hip degenerative changes. Right upper quadrant and pelvic surgical clips. IMPRESSION: Negative for acute traumatic injury. Limited evaluation due to overlapping osseous structures and overlying soft tissues. Electronically Signed   By: Tish Frederickson M.D.   On: 07/06/2023 14:31    Procedures Procedures    Medications Ordered in ED Medications  lidocaine (LIDODERM) 5 % 1 patch (1 patch Transdermal Patch Applied 07/06/23 1440)  oxyCODONE-acetaminophen (PERCOCET/ROXICET) 5-325 MG per tablet 1 tablet (1 tablet Oral Given 07/06/23 1439)    ED Course/ Medical Decision Making/ A&P                                 Medical Decision Making Amount and/or Complexity of Data Reviewed Radiology: ordered.  Risk Prescription drug management.   Patient presents to the ED for concern of low back pain following fall this morning at 0400, this involves an extensive number of treatment options, and is a complaint that carries with it a high risk of complications and morbidity.  The differential diagnosis includes fracture, dislocation, muscle strain   Co  morbidities that complicate the patient evaluation  None   Additional history obtained:  Additional history obtained from Family, Nursing, and Outside Medical Records   External records from outside source obtained and reviewed including RN triage note   Imaging Studies ordered:  I ordered imaging studies including Lumbar spine and R hip XR  I independently visualized and interpreted imaging which showed no acute fracture, dislocation, intraosseous abnormality I agree with the radiologist interpretation    Medicines ordered and prescription drug management:  I ordered medication including Percocet and lidocaine patches for pain management  Reevaluation of the patient after these medicines showed that the patient resolved I have reviewed the patients home medicines and have made adjustments as needed    Problem List / ED Course:  Fall, initial encounter Low back pain   Reevaluation:  After the interventions noted above, I reevaluated the patient and found that they have :resolved   Dispostion:  Patient is in acute distress secondary to pain and unable to sit still.  She is mildly hypertensive at 141/77 likely due to pain.  See HPI.  Physical exam is significant for TTP of lumbar mid spinous processes, right paraspinous musculature in lumbar region, and right hip pain.  No ecchymosis, laceration, swelling noted to back nor right hip.  Will provide pain management in ED while waiting for lumbar scans.  She has no additional complaints of pain nor injury, nor is any found during physical exam.  X-rays negative for intraosseous abnormality.  Upon reassessment, patient reports complete resolution of pain following Percocet.  Will provide prescription for naproxen and Flexeril to use as needed at home for pain and muscle spasms.  I discussed using Flexeril at night and not using it with alcohol nor driving heavy machinery as it can make you drowsy.  After consideration of the  diagnostic results and the patients response to treatment, I feel that the patent would benefit from outpatient management with pain control.  I discussed findings, disposition, return to emergency department precautions with patient who expresses understanding agrees with plan.  All questions answered to patient's satisfaction.  Return to  emergency department precautions include back pain red flags.        Final Clinical Impression(s) / ED Diagnoses Final diagnoses:  Fall, initial encounter    Rx / DC Orders ED Discharge Orders          Ordered    cyclobenzaprine (FLEXERIL) 10 MG tablet  2 times daily PRN        07/06/23 1610    naproxen (NAPROSYN) 375 MG tablet  2 times daily        07/06/23 1610              Mallory Neal, [place] 07/06/23 1614    Jacalyn Lefevre, MD 07/09/23 [phone]

## 2023-07-06 NOTE — ED Notes (Signed)
Pain 4/10 Pt resting comfortably Informed PA Vitals listed

## 2023-07-06 NOTE — ED Notes (Signed)
Pt stated that she missed a step, was one step up and fell backward to buttocks  Pain to RIGHT hip and back 10/10 Denies numbness in toes   Denies hitting head No LOC, no thinners

## 2023-07-09 ENCOUNTER — Emergency Department (HOSPITAL_COMMUNITY): Payer: Medicaid Other

## 2023-07-09 ENCOUNTER — Encounter (HOSPITAL_COMMUNITY): Payer: Self-pay

## 2023-07-09 ENCOUNTER — Other Ambulatory Visit: Payer: Self-pay

## 2023-07-09 ENCOUNTER — Emergency Department (HOSPITAL_COMMUNITY)
Admission: EM | Admit: 2023-07-09 | Discharge: 2023-07-09 | Disposition: A | Discharge status: Home / Self Care | Payer: Medicaid Other | Attending: Emergency Medicine | Admitting: Emergency Medicine

## 2023-07-09 DIAGNOSIS — W108XXA Fall (on) (from) other stairs and steps, initial encounter: Secondary | ICD-10-CM | POA: Diagnosis not present

## 2023-07-09 DIAGNOSIS — S32048A Other fracture of fourth lumbar vertebra, initial encounter for closed fracture: Secondary | ICD-10-CM | POA: Diagnosis not present

## 2023-07-09 DIAGNOSIS — M545 Low back pain, unspecified: Secondary | ICD-10-CM

## 2023-07-09 DIAGNOSIS — Y9301 Activity, walking, marching and hiking: Secondary | ICD-10-CM | POA: Diagnosis not present

## 2023-07-09 DIAGNOSIS — S32040A Wedge compression fracture of fourth lumbar vertebra, initial encounter for closed fracture: Secondary | ICD-10-CM

## 2023-07-09 LAB — CBC
HCT: 40.6 % (ref 36.0–46.0)
Hemoglobin: 13.4 g/dL (ref 12.0–15.0)
MCH: 35 pg — ABNORMAL HIGH (ref 26.0–34.0)
MCHC: 33 g/dL (ref 30.0–36.0)
MCV: 106 fL — ABNORMAL HIGH (ref 80.0–100.0)
Platelets: 334 10*3/uL (ref 150–400)
RBC: 3.83 MIL/uL — ABNORMAL LOW (ref 3.87–5.11)
RDW: 13.8 % (ref 11.5–15.5)
WBC: 7.2 10*3/uL (ref 4.0–10.5)
nRBC: 0 % (ref 0.0–0.2)

## 2023-07-09 LAB — BASIC METABOLIC PANEL
Anion gap: 8 (ref 5–15)
BUN: 6 mg/dL (ref 6–20)
CO2: 30 mmol/L (ref 22–32)
Calcium: 9.2 mg/dL (ref 8.9–10.3)
Chloride: 100 mmol/L (ref 98–111)
Creatinine, Ser: 0.51 mg/dL (ref 0.44–1.00)
GFR, Estimated: >60 mL/min (ref >60)
Glucose, Bld: 87 mg/dL (ref 70–99)
Potassium: 4.2 mmol/L (ref 3.5–5.1)
Sodium: 138 mmol/L (ref 135–145)

## 2023-07-09 MED ORDER — OXYCODONE-ACETAMINOPHEN 5-325 MG PO TABS
1.0000 | ORAL_TABLET | Freq: Once | ORAL | Status: AC
  Administered 2023-07-09: 1 via ORAL
  Filled 2023-07-09: qty 1

## 2023-07-09 MED ORDER — OXYCODONE HCL 5 MG PO TABS: 2.5000 mg | ORAL_TABLET | Freq: Four times a day (QID) | ORAL | 0 refills | Status: AC | PRN

## 2023-07-09 MED ORDER — PREDNISONE 10 MG (21) PO TBPK: ORAL_TABLET | Freq: Every day | ORAL | 0 refills | Status: AC

## 2023-07-09 NOTE — ED Provider Notes (Signed)
[place] EMERGENCY DEPARTMENT AT [facility] Provider Note   CSN: [id number] Arrival date & time: 07/09/23  1058     History  Chief Complaint  Patient presents with   Back Pain    Mallory Neal is a 52 y.o. female.   Back Pain   52 year old female presents emergency department with complaints of back pain.  Patient reports back pain initially occurred on 07/06/2023.  States that she was walking down steps and slipped falling backwards hitting her hip and low back on the steps behind her.  Was seen in the emergency department at that time with reassuring x-ray imaging of her low back as well as her hip.  Patient presents due to continued pain as well as now experiencing tingling bilateral legs.  Denies any saddle anesthesia, bowel/bladder dysfunction, weakness in lower extremities.  Has been taking her over-the-counter medications without significant improvement of symptoms.  Presents emergency department for further assessment/evaluation.  Past medical history significant for anemia, cocaine abuse, cannabis abuse, alcohol abuse  Home Medications Prior to Admission medications   Medication Sig Start Date End Date Taking? Authorizing Provider  oxyCODONE (ROXICODONE) 5 MG immediate release tablet Take 0.5 tablets (2.5 mg total) by mouth every 6 (six) hours as needed for severe pain (pain score 7-10). 07/09/23  Yes Sherian Maroon A, PA  predniSONE (STERAPRED UNI-PAK 21 TAB) 10 MG (21) TBPK tablet Take by mouth daily. Take 6 tabs by mouth daily  for 2 days, then 5 tabs for 2 days, then 4 tabs for 2 days, then 3 tabs for 2 days, 2 tabs for 2 days, then 1 tab by mouth daily for 2 days 07/09/23  Yes Sherian Maroon A, PA  cyclobenzaprine (FLEXERIL) 10 MG tablet Take 1 tablet (10 mg total) by mouth 2 (two) times daily as needed for up to 5 days for muscle spasms. 07/06/23 07/11/23  Judithann Sheen, PA  gabapentin (NEURONTIN) 100 MG capsule Take 1 capsule (100 mg total) by mouth 3  (three) times daily. 12/23/21   Ntuen, Jesusita Oka, FNP  naproxen (NAPROSYN) 375 MG tablet Take 1 tablet (375 mg total) by mouth 2 (two) times daily. 07/06/23   Judithann Sheen, PA  pantoprazole (PROTONIX) 40 MG tablet Take 1 tablet (40 mg total) by mouth daily. 12/24/21   Ntuen, Jesusita Oka, FNP  sertraline (ZOLOFT) 50 MG tablet Take 1 tablet (50 mg total) by mouth daily. 12/24/21   Ntuen, Jesusita Oka, FNP  traZODone (DESYREL) 50 MG tablet Take 1 tablet (50 mg total) by mouth at bedtime as needed for sleep. 12/23/21   Cecilie Lowers, FNP      Allergies    Codeine    Review of Systems   Review of Systems  Musculoskeletal:  Positive for back pain.  All other systems reviewed and are negative.   Physical Exam Updated Vital Signs BP 129/73   Pulse 68   Temp 98.3 F (36.8 C) (Oral)   Resp 16   Ht 5\' 6"  (1.676 m)   Wt 82.6 kg   LMP 10/25/2016   SpO2 95%   BMI 29.38 kg/m  Physical Exam Vitals and nursing note reviewed.  Constitutional:      General: She is not in acute distress.    Appearance: She is well-developed.  HENT:     Head: Normocephalic and atraumatic.  Eyes:     Conjunctiva/sclera: Conjunctivae normal.  Cardiovascular:     Rate and Rhythm: Normal rate and regular rhythm.  Heart sounds: No murmur heard. Pulmonary:     Effort: Pulmonary effort is normal. No respiratory distress.     Breath sounds: Normal breath sounds.  Abdominal:     Palpations: Abdomen is soft.     Tenderness: There is no abdominal tenderness.  Musculoskeletal:        General: No swelling.     Cervical back: Neck supple.     Comments: No midline tenderness of cervical, thoracic spine.  Midline tenderness of lumbar spine L2-L4 without step-off or deformity.  Paraspinal tenderness right greater than left in lumbar region.  Muscular strength 5 out of 5 bilateral hip flexion/extension, knee flexion/extension, ankle plantar/dorsi flexion.  Patient with reported decrease sensation anteriorly bilateral extremities down  to ankles.  DTR symmetric at patella.  Pedal pulses 2+ bilaterally.  Skin:    General: Skin is warm and dry.     Capillary Refill: Capillary refill takes less than 2 seconds.  Neurological:     Mental Status: She is alert.  Psychiatric:        Mood and Affect: Mood normal.     ED Results / Procedures / Treatments   Labs (all labs ordered are listed, but only abnormal results are displayed) Labs Reviewed  CBC - Abnormal; Notable for the following components:      Result Value   RBC 3.83 (*)    MCV 106.0 (*)    MCH 35.0 (*)    All other components within normal limits  BASIC METABOLIC PANEL    EKG None  Radiology MR LUMBAR SPINE WO CONTRAST  Result Date: 07/09/2023 CLINICAL DATA:  Lumbar radiculopathy, trauma.  Low back pain. EXAM: MRI LUMBAR SPINE WITHOUT CONTRAST TECHNIQUE: Multiplanar, multisequence MR imaging of the lumbar spine was performed. No intravenous contrast was administered. COMPARISON:  Lumbar spine radiographs 07/06/2023 FINDINGS: Segmentation:  Standard. Alignment:  Trace anterolisthesis of L3 on L4. Vertebrae: L4 superior endplate compression fracture with up to 20% anterior vertebral body height loss on the right and mild marrow edema. Periarticular facet edema on the right at L2-3. No suspicious marrow lesion. Conus medullaris and cauda equina: Conus extends to the L1 level. Conus and cauda equina appear normal. Paraspinal and other soft tissues: Unremarkable. Disc levels: T12-L1: Negative. L1-2: Mild facet hypertrophy without disc herniation or stenosis. L2-3: Moderate facet hypertrophy without disc herniation or stenosis. L3-4: Anterolisthesis with mild bulging of uncovered disc, a right foraminal to extraforaminal disc protrusion, and severe facet and ligamentum flavum hypertrophy result in severe spinal stenosis and moderate to severe right neural foraminal stenosis. Potential right L3 and bilateral L4 nerve root impingement. 1 cm synovial cyst at the  posteroinferior aspect of the left facet joint, not in a position to cause neural impingement. L4-5: Disc bulging, a left paracentral to left foraminal disc protrusion, and moderate facet and ligamentum flavum hypertrophy result in severe spinal stenosis, asymmetrically severe left lateral recess stenosis, and mild right and moderate left neural foraminal stenosis. Potential bilateral L5 nerve root impingement, particularly on the left. L5-S1: Mild disc bulging with a small right subarticular annular fissure and moderate facet hypertrophy. No significant stenosis. IMPRESSION: 1. Mild, acute/subacute L4 compression fracture. 2. Severe spinal stenosis and moderate to severe right neural foraminal stenosis at L3-4. 3. Severe spinal stenosis and moderate left neural foraminal stenosis at L4-5. Electronically Signed   By: Sebastian Ache M.D.   On: 07/09/2023 14:32    Procedures Procedures    Medications Ordered in ED Medications  oxyCODONE-acetaminophen (PERCOCET/ROXICET) 5-325  MG per tablet 1 tablet (1 tablet Oral Given 07/09/23 1251)    ED Course/ Medical Decision Making/ A&P Clinical Course as of 07/09/23 1453  Tue Jul 09, 2023  1446 Reevaluation the patient showed significant improvement of symptoms after medication was administered.  Patient no longer complaining of paresthesias/tingling in legs.  Able to ambulate without difficulty. [CR]    Clinical Course User Index [CR] Peter Garter, PA                                 Medical Decision Making Amount and/or Complexity of Data Reviewed Labs: ordered. Radiology: ordered.  Risk Prescription drug management.   This patient presents to the ED for concern of back pain, this involves an extensive number of treatment options, and is a complaint that carries with it a high risk of complications and morbidity.  The differential diagnosis includes fracture, strain/sprain, dislocation, herniated disc, cauda equina, spinal epidural abscess, AAA,  aortic dissection, other    Co morbidities that complicate the patient evaluation  See HPI   Additional history obtained:  Additional history obtained from EMR External records from outside source obtained and reviewed including hospital records   Lab Tests:  I Ordered, and personally interpreted labs.  The pertinent results include: No electrolyte abnormalities.  No renal dysfunction.  No leukocytosis.  No evidence of anemia.  Platelets within range.   Imaging Studies ordered:  I ordered imaging studies including MRI lumbar spine I independently visualized and interpreted imaging which showed mild acute/subacute L4 compression fracture.  Spinal stenosis and moderate to severe right neural foraminal stenosis L3-4.  Spinal stenosis and moderate left neuroforaminal stenosis at L4-5 I agree with the radiologist interpretation  Cardiac Monitoring: / EKG:  The patient was maintained on a cardiac monitor.  I personally viewed and interpreted the cardiac monitored which showed an underlying rhythm of: Sinus rhythm   Consultations Obtained:  N/a   Problem List / ED Course / Critical interventions / Medication management  Low back pain I ordered medication including Percocet   Reevaluation of the patient after these medicines showed that the patient improved I have reviewed the patients home medicines and have made adjustments as needed   Social Determinants of Health:  Polysubstance use   Test / Admission - Considered:  Low back pain, lumbar fracture Vitals signs within normal range and stable throughout visit. Laboratory/imaging studies significant for: See above 52 year old female presents emergency department with complaints of low back pain after mechanical fall that occurred 3 ago.  Patient with persistent back pain prompting visit to the emergency department today.  Patient initially complaining of tingling bilateral legs.  On exam, patient initially with reported  decrease sensation bilateral lower extremities anteriorly without obvious weakness, hypo-/hyperreflexia, saddle anesthesia, bowel/bladder dysfunction.  Given patient's traumatic injury in the setting of reported tingling sensation, MR was obtained which was concerning for acute/subacute L4 fracture as well as spinal stenosis findings as above.  Patient treated with pain medication while in the ED and noted improvement of pain and resolution of tingling sensation.  Patient ambulating in the ED without difficulty.  Suspect the patient's symptoms are likely secondary to acute/subacute L4 fracture.  Patient placed in an LSO brace as well as given prednisone taper for treatment of pain.  Will recommend follow-up with neurosurgery in outpatient setting for further assessment/evaluation.  Treatment plan discussed at length with patient and she acknowledged understanding was  agreeable to said plan.  Patient overall well-appearing, afebrile in no acute distress. Worrisome signs and symptoms were discussed with the patient, and the patient acknowledged understanding to return to the ED if noticed. Patient was stable upon discharge.          Final Clinical Impression(s) / ED Diagnoses Final diagnoses:  Acute midline low back pain without sciatica  Compression fracture of L4 vertebra, initial encounter [facility])    Rx / DC Orders ED Discharge Orders     None         Peter Garter, [place] 07/09/23 1453    Tanda Rockers A, DO 07/14/23 1008

## 2023-07-09 NOTE — ED Notes (Signed)
Patient transported to MRI 

## 2023-07-09 NOTE — ED Triage Notes (Signed)
Pt was here Saturday for back pain d/t a fall. Pt states the pain is worse and tingling down both legs.

## 2023-07-09 NOTE — ED Notes (Signed)
Ortho in room to place outside binder brace.

## 2023-07-09 NOTE — Discharge Instructions (Addendum)
As discussed, MRI did show compression fracture of L4 vertebrae as well as multiple areas of herniated disc and spinal canal stenosis.  Will place you on prednisone for treatment of pain and inflammation.  Will recommend follow-up with neurosurgery in the outpatient setting regarding your low back pain as well as MRI imaging findings.  Please do not hesitate to return if you develop saddle anesthesia, bowel/bladder dysfunction, weakness in legs, inability to walk or other abnormality we discussed.

## 2023-07-09 NOTE — ED Notes (Addendum)
[employer] called for outside vendor brace, pt awaiting brace application

## 2023-10-08 ENCOUNTER — Encounter (HOSPITAL_COMMUNITY): Payer: Self-pay

## 2023-10-08 ENCOUNTER — Emergency Department (HOSPITAL_COMMUNITY)
Admission: EM | Admit: 2023-10-08 | Discharge: 2023-10-09 | Disposition: A | Discharge status: Home / Self Care | Payer: Medicaid Other | Attending: Emergency Medicine | Admitting: Emergency Medicine

## 2023-10-08 ENCOUNTER — Other Ambulatory Visit: Payer: Self-pay

## 2023-10-08 ENCOUNTER — Emergency Department (HOSPITAL_COMMUNITY): Payer: Medicaid Other

## 2023-10-08 DIAGNOSIS — R059 Cough, unspecified: Secondary | ICD-10-CM | POA: Diagnosis present

## 2023-10-08 DIAGNOSIS — F17290 Nicotine dependence, other tobacco product, uncomplicated: Secondary | ICD-10-CM | POA: Diagnosis not present

## 2023-10-08 DIAGNOSIS — J101 Influenza due to other identified influenza virus with other respiratory manifestations: Secondary | ICD-10-CM | POA: Diagnosis not present

## 2023-10-08 LAB — RESP PANEL BY RT-PCR (RSV, FLU A&B, COVID)  RVPGX2
Influenza A by PCR: POSITIVE — AB
Influenza B by PCR: NEGATIVE
Resp Syncytial Virus by PCR: NEGATIVE
SARS Coronavirus 2 by RT PCR: NEGATIVE

## 2023-10-08 NOTE — ED Provider Triage Note (Signed)
 Emergency Medicine Provider Triage Evaluation Note  Mallory Neal , a 53 y.o. female  was evaluated in triage.  Pt complains of flu-like symptoms, onset Sunday. Has been using dayquil and nyquil.  Review of Systems  Positive: Cough, sore throat, voice change. Episode of post-tussive emesis today Negative: Fever, chills, abdominal pain, nausea  Physical Exam  BP (!) 152/85   Pulse 92   Temp 98.9 F (37.2 C) (Oral)   LMP 10/25/2016   SpO2 96%  Gen:   Awake, appears uncomfortable Resp:  Normal effort  MSK:   Moves extremities without difficulty  Other:    Medical Decision Making  Medically screening exam initiated at 8:46 PM.  Appropriate orders placed.  Mallory Neal was informed that the remainder of the evaluation will be completed by another provider, this initial triage assessment does not replace that evaluation, and the importance of remaining in the ED until their evaluation is complete.     Felicie Morn, NP 10/08/23 2049

## 2023-10-08 NOTE — ED Triage Notes (Signed)
 Pt c/o runny nose nausea and cough since Sunday night. Pt is not having productive cough. Pt denies fevers. Pt is losing voice. Denies chest pain.

## 2023-10-09 MED ORDER — ALBUTEROL SULFATE HFA 108 (90 BASE) MCG/ACT IN AERS
2.0000 | INHALATION_SPRAY | Freq: Once | RESPIRATORY_TRACT | Status: AC
  Administered 2023-10-09: 2 via RESPIRATORY_TRACT
  Filled 2023-10-09: qty 6.7

## 2023-10-09 MED ORDER — BENZONATATE 100 MG PO CAPS: 100.0000 mg | ORAL_CAPSULE | Freq: Three times a day (TID) | ORAL | 0 refills | Status: AC

## 2023-10-09 MED ORDER — OSELTAMIVIR PHOSPHATE 75 MG PO CAPS: 75.0000 mg | ORAL_CAPSULE | Freq: Two times a day (BID) | ORAL | 0 refills | Status: AC

## 2023-10-09 NOTE — ED Provider Notes (Signed)
 [facility] Emergency Department Provider Note MRN:  [id number]  Arrival date & time: 10/09/23     Chief Complaint   Cough   History of Present Illness   Mallory Neal is a 53 y.o. year-old female with no pertinent past medical history presenting to the ED with chief complaint of cough.  Cough, malaise, fever for the past day or 2.  No shortness of breath, no chest pain, no abdominal pain, no rash.  Review of Systems  A thorough review of systems was obtained and all systems are negative except as noted in the HPI and PMH.   Patient's Health History    Past Medical History:  Diagnosis Date   Anemia    Arthritis    Chest pain    Heart murmur     Past Surgical History:  Procedure Laterality Date   ABDOMINAL HYSTERECTOMY N/A 12/05/2016   Procedure: HYSTERECTOMY ABDOMINAL;  Surgeon: Lazaro Arms, MD;  Location: [facility] ORS;  Service: Gynecology;  Laterality: N/A;   CHOLECYSTECTOMY     ORIF ANKLE FRACTURE Left 03/19/2019   Procedure: OPEN REDUCTION INTERNAL FIXATION (ORIF) ANKLE WITH SYNDESMOSIS FIXATION;  Surgeon: Yolonda Kida, MD;  Location: [facility] OR;  Service: Orthopedics;  Laterality: Left;  90 MINS   SALPINGOOPHORECTOMY Bilateral 12/05/2016   Procedure: SALPINGO OOPHORECTOMY;  Surgeon: Lazaro Arms, MD;  Location: [facility] ORS;  Service: Gynecology;  Laterality: Bilateral;    Family History  Problem Relation Age of Onset   Diabetes Paternal Grandfather    Heart disease Paternal Grandmother    Heart disease Maternal Grandmother    Cancer Father        lung    Social History   Socioeconomic History   Marital status: Single    Spouse name: Not on file   Number of children: Not on file   Years of education: Not on file   Highest education level: Not on file  Occupational History   Not on file  Tobacco Use   Smoking status: Never   Smokeless tobacco: Current    Types: Snuff  Substance and Sexual Activity   Alcohol use: Yes    Alcohol/week: 15.0  standard drinks of alcohol    Types: 15 Cans of beer per week    Comment: "I drink 12-15 pks beer everyday for years now"   Drug use: Yes    Types: Marijuana    Comment: last use x 1 week ago   Sexual activity: Not Currently    Birth control/protection: None  Other Topics Concern   Not on file  Social History Narrative   Pt is a 53 y/o caucasian female. Lives with an older gentleman friend. No stable job / income at this time.   Social Drivers of Corporate investment banker Strain: Not on file  Food Insecurity: Not on file  Transportation Needs: Not on file  Physical Activity: Not on file  Stress: Not on file  Social Connections: Not on file  Intimate Partner Violence: Not on file     Physical Exam   Vitals:   10/08/23 2045 10/09/23 0021  BP: (!) 152/85 125/67  Pulse: 92 85  Resp: 16 14  Temp: 98.9 F (37.2 C) 98.6 F (37 C)  SpO2: 96% 93%    CONSTITUTIONAL: Well-appearing, NAD NEURO/PSYCH:  Alert and oriented x 3, no focal deficits EYES:  eyes equal and reactive ENT/NECK:  no LAD, no JVD CARDIO: Regular rate, well-perfused, normal S1 and S2 PULM:  CTAB no wheezing or rhonchi GI/GU:  non-distended, non-tender MSK/SPINE:  No gross deformities, no edema SKIN:  no rash, atraumatic   *Additional and/or pertinent findings included in MDM below  Diagnostic and Interventional Summary    EKG Interpretation Date/Time:    Ventricular Rate:    PR Interval:    QRS Duration:    QT Interval:    QTC Calculation:   R Axis:      Text Interpretation:         Labs Reviewed  RESP PANEL BY RT-PCR (RSV, FLU A&B, COVID)  RVPGX2 - Abnormal; Notable for the following components:      Result Value   Influenza A by PCR POSITIVE (*)    All other components within normal limits    DG Chest Portable 1 View  Final Result      Medications  albuterol (VENTOLIN HFA) 108 (90 Base) MCG/ACT inhaler 2 puff (2 puffs Inhalation Given 10/09/23 0044)     Procedures  /  Critical  Care Procedures  ED Course and Medical Decision Making  Initial Impression and Ddx Viral illness, patient is sitting comfortably with no acute distress, reassuring vital signs.  Lungs clear.  Past medical/surgical history that increases complexity of ED encounter: None  Interpretation of Diagnostics I personally reviewed the Chest Xray and my interpretation is as follows: No lobar opacity or pneumothorax  Flu positive  Patient Reassessment and Ultimate Disposition/Management     Discharge  Patient management required discussion with the following services or consulting groups:  None  Complexity of Problems Addressed Acute illness or injury that poses threat of life of bodily function  Additional Data Reviewed and Analyzed Further history obtained from: Further history from spouse/family member  Additional Factors Impacting ED Encounter Risk Prescriptions  Elmer Sow. Pilar Plate, MD [facility] Emergency Medicine [facility] [email or web address]  Final Clinical Impressions(s) / ED Diagnoses     ICD-10-CM   1. Influenza A  J10.1       ED Discharge Orders          Ordered    oseltamivir (TAMIFLU) 75 MG capsule  Every 12 hours        10/09/23 0103    benzonatate (TESSALON) 100 MG capsule  Every 8 hours        10/09/23 0103             Discharge Instructions Discussed with and Provided to Patient:    Discharge Instructions      You were evaluated in the Emergency Department and after careful evaluation, we did not find any emergent condition requiring admission or further testing in the hospital.  Your exam/testing today is overall reassuring.  Symptoms seem to be due to the flu.  You tested positive here in the emergency department.  Take the Tessalon medication to suppress your cough.  Take the Tamiflu to help you get better faster.  Plenty of fluids and rest.  Please return to the Emergency Department if you experience any worsening of your  condition.   Thank you for allowing [place] to be a part of your care.      Sabas Sous, MD 10/09/23 [phone]

## 2023-10-09 NOTE — Discharge Instructions (Signed)
 You were evaluated in the Emergency Department and after careful evaluation, we did not find any emergent condition requiring admission or further testing in the hospital.  Your exam/testing today is overall reassuring.  Symptoms seem to be due to the flu.  You tested positive here in the emergency department.  Take the Tessalon medication to suppress your cough.  Take the Tamiflu to help you get better faster.  Plenty of fluids and rest.  Please return to the Emergency Department if you experience any worsening of your condition.   Thank you for allowing [place] to be a part of your care.

## 2023-11-20 ENCOUNTER — Emergency Department (HOSPITAL_COMMUNITY)
Admission: EM | Admit: 2023-11-20 | Discharge: 2023-11-20 | Disposition: A | Discharge status: Home / Self Care | Attending: Emergency Medicine | Admitting: Emergency Medicine

## 2023-11-20 ENCOUNTER — Other Ambulatory Visit: Payer: Self-pay

## 2023-11-20 ENCOUNTER — Encounter (HOSPITAL_COMMUNITY): Payer: Self-pay | Admitting: Emergency Medicine

## 2023-11-20 DIAGNOSIS — M7981 Nontraumatic hematoma of soft tissue: Secondary | ICD-10-CM | POA: Insufficient documentation

## 2023-11-20 DIAGNOSIS — T148XXA Other injury of unspecified body region, initial encounter: Secondary | ICD-10-CM

## 2023-11-20 DIAGNOSIS — R21 Rash and other nonspecific skin eruption: Secondary | ICD-10-CM | POA: Insufficient documentation

## 2023-11-20 LAB — COMPREHENSIVE METABOLIC PANEL WITH GFR
ALT: 32 U/L (ref 0–44)
AST: 37 U/L (ref 15–41)
Albumin: 4 g/dL (ref 3.5–5.0)
Alkaline Phosphatase: 79 U/L (ref 38–126)
Anion gap: 12 (ref 5–15)
BUN: 8 mg/dL (ref 6–20)
CO2: 27 mmol/L (ref 22–32)
Calcium: 9.7 mg/dL (ref 8.9–10.3)
Chloride: 97 mmol/L — ABNORMAL LOW (ref 98–111)
Creatinine, Ser: 0.66 mg/dL (ref 0.44–1.00)
GFR, Estimated: >60 mL/min (ref >60)
Glucose, Bld: 94 mg/dL (ref 70–99)
Potassium: 3.8 mmol/L (ref 3.5–5.1)
Sodium: 136 mmol/L (ref 135–145)
Total Bilirubin: 0.6 mg/dL (ref 0.0–1.2)
Total Protein: 7.8 g/dL (ref 6.5–8.1)

## 2023-11-20 LAB — PROTIME-INR
INR: 1 (ref 0.8–1.2)
Prothrombin Time: 12.9 s (ref 11.4–15.2)

## 2023-11-20 LAB — CBC
HCT: 39.2 % (ref 36.0–46.0)
Hemoglobin: 13.1 g/dL (ref 12.0–15.0)
MCH: 34.5 pg — ABNORMAL HIGH (ref 26.0–34.0)
MCHC: 33.4 g/dL (ref 30.0–36.0)
MCV: 103.2 fL — ABNORMAL HIGH (ref 80.0–100.0)
Platelets: 275 10*3/uL (ref 150–400)
RBC: 3.8 MIL/uL — ABNORMAL LOW (ref 3.87–5.11)
RDW: 13 % (ref 11.5–15.5)
WBC: 9.1 10*3/uL (ref 4.0–10.5)
nRBC: 0 % (ref 0.0–0.2)

## 2023-11-20 LAB — LIPASE, BLOOD: Lipase: 32 U/L (ref 11–51)

## 2023-11-20 LAB — APTT: aPTT: 30 s (ref 24–36)

## 2023-11-20 NOTE — ED Triage Notes (Signed)
 Pt arrives to ED c/o deep bruise to her right flank. Pt states area is sore to the touch. Pt denies any known injuries.

## 2023-11-20 NOTE — Discharge Instructions (Signed)
 Your labs and exam are reassuring.  Sometimes bruises can occur spontaneously and sometimes we can injure ourselves enough to cause a bruise but not be aware of a significant injury.  With your exam and labs looking good,  you do not need any further tests tonight,  but plan followup with your primary provider if you have any further episodes of this.

## 2023-11-20 NOTE — ED Provider Notes (Signed)
 [facility] AT [facility] Provider Note   CSN: [id number] Arrival date & time: 11/20/23  1951     History  Chief Complaint  Patient presents with   Abdominal Pain    Mallory Neal is a 53 y.o. female presenting with bruise on her right abdomen.  She woke late last night and was itchy at the site,  scratched it and when she examined it found to have a dark bruise.  She denies known injury, falls.  Denies unexplained prior episodes of bleeding or bruising. She is not on blood thinning medicines,  denies fevers, n/v, abdominal pain but is sore at the site of the bruise.  No known bleeding disorders,  liver problems.  She does drink 6-8 etoh beverages per day.   The history is provided by the patient.       Home Medications Prior to Admission medications   Medication Sig Start Date End Date Taking? Authorizing Provider  benzonatate (TESSALON) 100 MG capsule Take 1 capsule (100 mg total) by mouth every 8 (eight) hours. 10/09/23   Sabas Sous, MD  gabapentin (NEURONTIN) 100 MG capsule Take 1 capsule (100 mg total) by mouth 3 (three) times daily. 12/23/21   Ntuen, Jesusita Oka, FNP  naproxen (NAPROSYN) 375 MG tablet Take 1 tablet (375 mg total) by mouth 2 (two) times daily. 07/06/23   Judithann Sheen, PA  oxyCODONE (ROXICODONE) 5 MG immediate release tablet Take 0.5 tablets (2.5 mg total) by mouth every 6 (six) hours as needed for severe pain (pain score 7-10). 07/09/23   Sherian Maroon A, PA  pantoprazole (PROTONIX) 40 MG tablet Take 1 tablet (40 mg total) by mouth daily. 12/24/21   Ntuen, Jesusita Oka, FNP  predniSONE (STERAPRED UNI-PAK 21 TAB) 10 MG (21) TBPK tablet Take by mouth daily. Take 6 tabs by mouth daily  for 2 days, then 5 tabs for 2 days, then 4 tabs for 2 days, then 3 tabs for 2 days, 2 tabs for 2 days, then 1 tab by mouth daily for 2 days 07/09/23   Sherian Maroon A, PA  sertraline (ZOLOFT) 50 MG tablet Take 1 tablet (50 mg total) by mouth daily. 12/24/21    Ntuen, Jesusita Oka, FNP  traZODone (DESYREL) 50 MG tablet Take 1 tablet (50 mg total) by mouth at bedtime as needed for sleep. 12/23/21   Cecilie Lowers, FNP      Allergies    Codeine    Review of Systems   Review of Systems  Constitutional:  Negative for chills and fever.  Eyes: Negative.   Respiratory:  Negative for chest tightness and shortness of breath.   Cardiovascular:  Negative for chest pain.  Gastrointestinal:  Negative for abdominal pain and nausea.  Genitourinary: Negative.   Musculoskeletal:  Negative for arthralgias, joint swelling and neck pain.  Skin:  Positive for color change. Negative for rash and wound.  Neurological:  Negative for dizziness, weakness, light-headedness, numbness and headaches.  Psychiatric/Behavioral: Negative.    All other systems reviewed and are negative.   Physical Exam Updated Vital Signs BP (!) 142/64   Pulse 71   Temp 99.4 F (37.4 C) (Oral)   Resp 18   Ht 5\' 6"  (1.676 m)   Wt 82.6 kg   LMP 10/25/2016   SpO2 93%   BMI 29.39 kg/m  Physical Exam Constitutional:      General: She is not in acute distress.    Appearance: She is well-developed.  HENT:     Head: Normocephalic.  Cardiovascular:     Rate and Rhythm: Normal rate.  Pulmonary:     Effort: Pulmonary effort is normal.     Breath sounds: No wheezing.  Abdominal:     General: Bowel sounds are normal. There are signs of injury.     Tenderness: There is abdominal tenderness in the right lower quadrant.     Comments: Deeply colored oval bruise right lower abdomen approx 6 cm in greatest diameter, small area of unaffected skin within the bruise site.  No punctures,  skin intact.  Tenderness localized to site of bruise.   Musculoskeletal:        General: Normal range of motion.     Cervical back: Neck supple.  Skin:    Findings: Rash present.     ED Results / Procedures / Treatments   Labs (all labs ordered are listed, but only abnormal results are displayed) Labs Reviewed   COMPREHENSIVE METABOLIC PANEL WITH GFR - Abnormal; Notable for the following components:      Result Value   Chloride 97 (*)    All other components within normal limits  CBC - Abnormal; Notable for the following components:   RBC 3.80 (*)    MCV 103.2 (*)    MCH 34.5 (*)    All other components within normal limits  LIPASE, BLOOD  PROTIME-INR  APTT    EKG None  Radiology No results found.  Procedures Procedures    Medications Ordered in ED Medications - No data to display  ED Course/ Medical Decision Making/ A&P                                 Medical Decision Making Pt presenting with solitary unexplained bruise.  Labs reassuring,  suspect pt probably inadvertently struck abd with object causing injury.   Amount and/or Complexity of Data Reviewed Labs: ordered.    Details: Reviewed,  normal platelet count,  normal coags.   Risk OTC drugs.           Final Clinical Impression(s) / ED Diagnoses Final diagnoses:  Bruise    Rx / DC Orders ED Discharge Orders     None         Victoriano Lain 11/20/23 2327    Jacalyn Lefevre, MD 11/20/23 2330

## 2023-11-20 NOTE — ED Notes (Signed)
 Pt reported she noticed the bruise when she went to the bathroom this morning. She was scratching the area she pulled up her shirt and noticed the large bruise. This nurse observed the area and there is a large dark spot on her right flank/side that is blue and purple. In the middle of the spot is a circle that is a similar color to the pts skin tone.

## 2024-03-24 IMAGING — DX DG CHEST 2V
2 series · 2 of 2 positions shown · non-contrast
Comparison: 02/24/2019

CLINICAL DATA: Cough, raspy voice

EXAM:
CHEST - 2 VIEW

[chest pa]
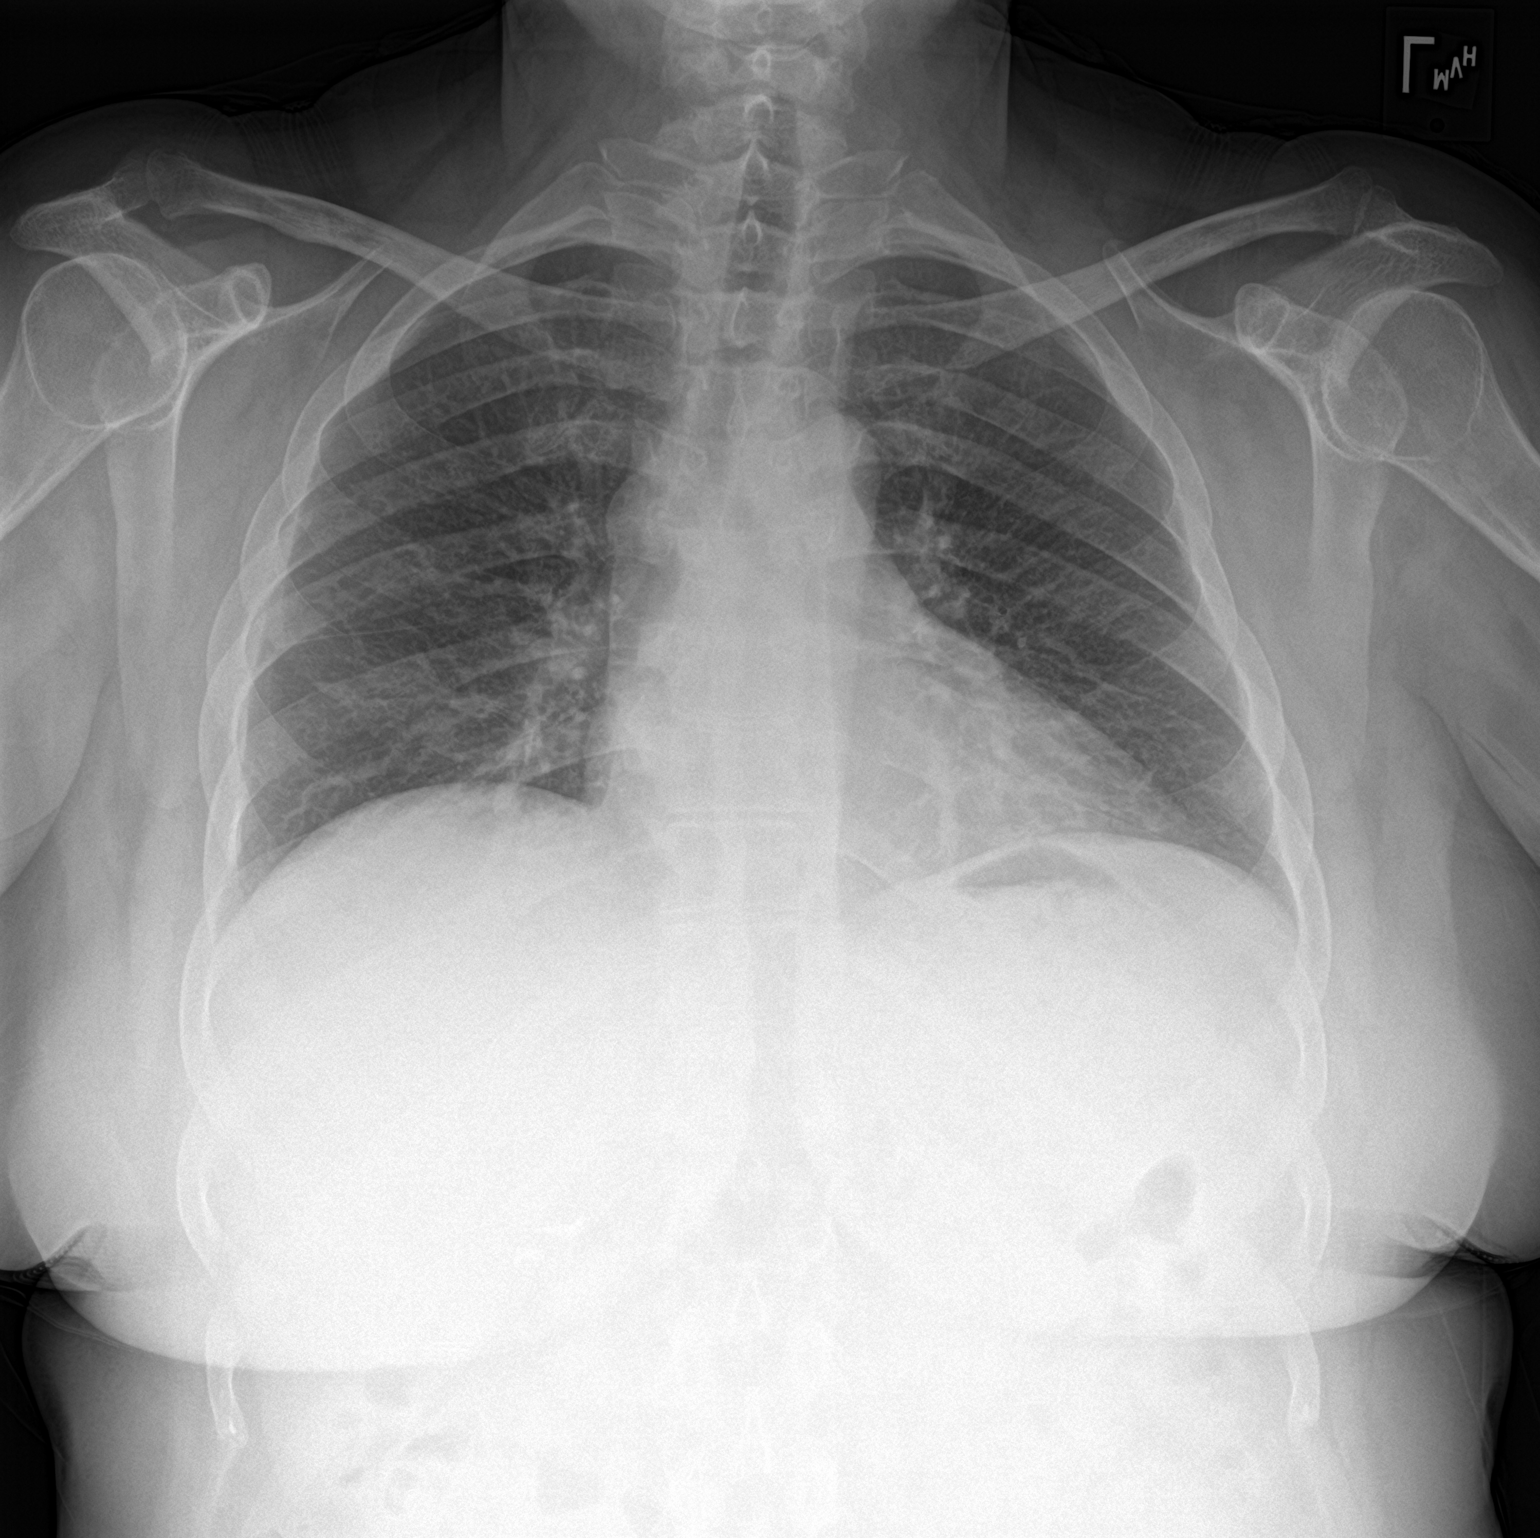

[chest lat]
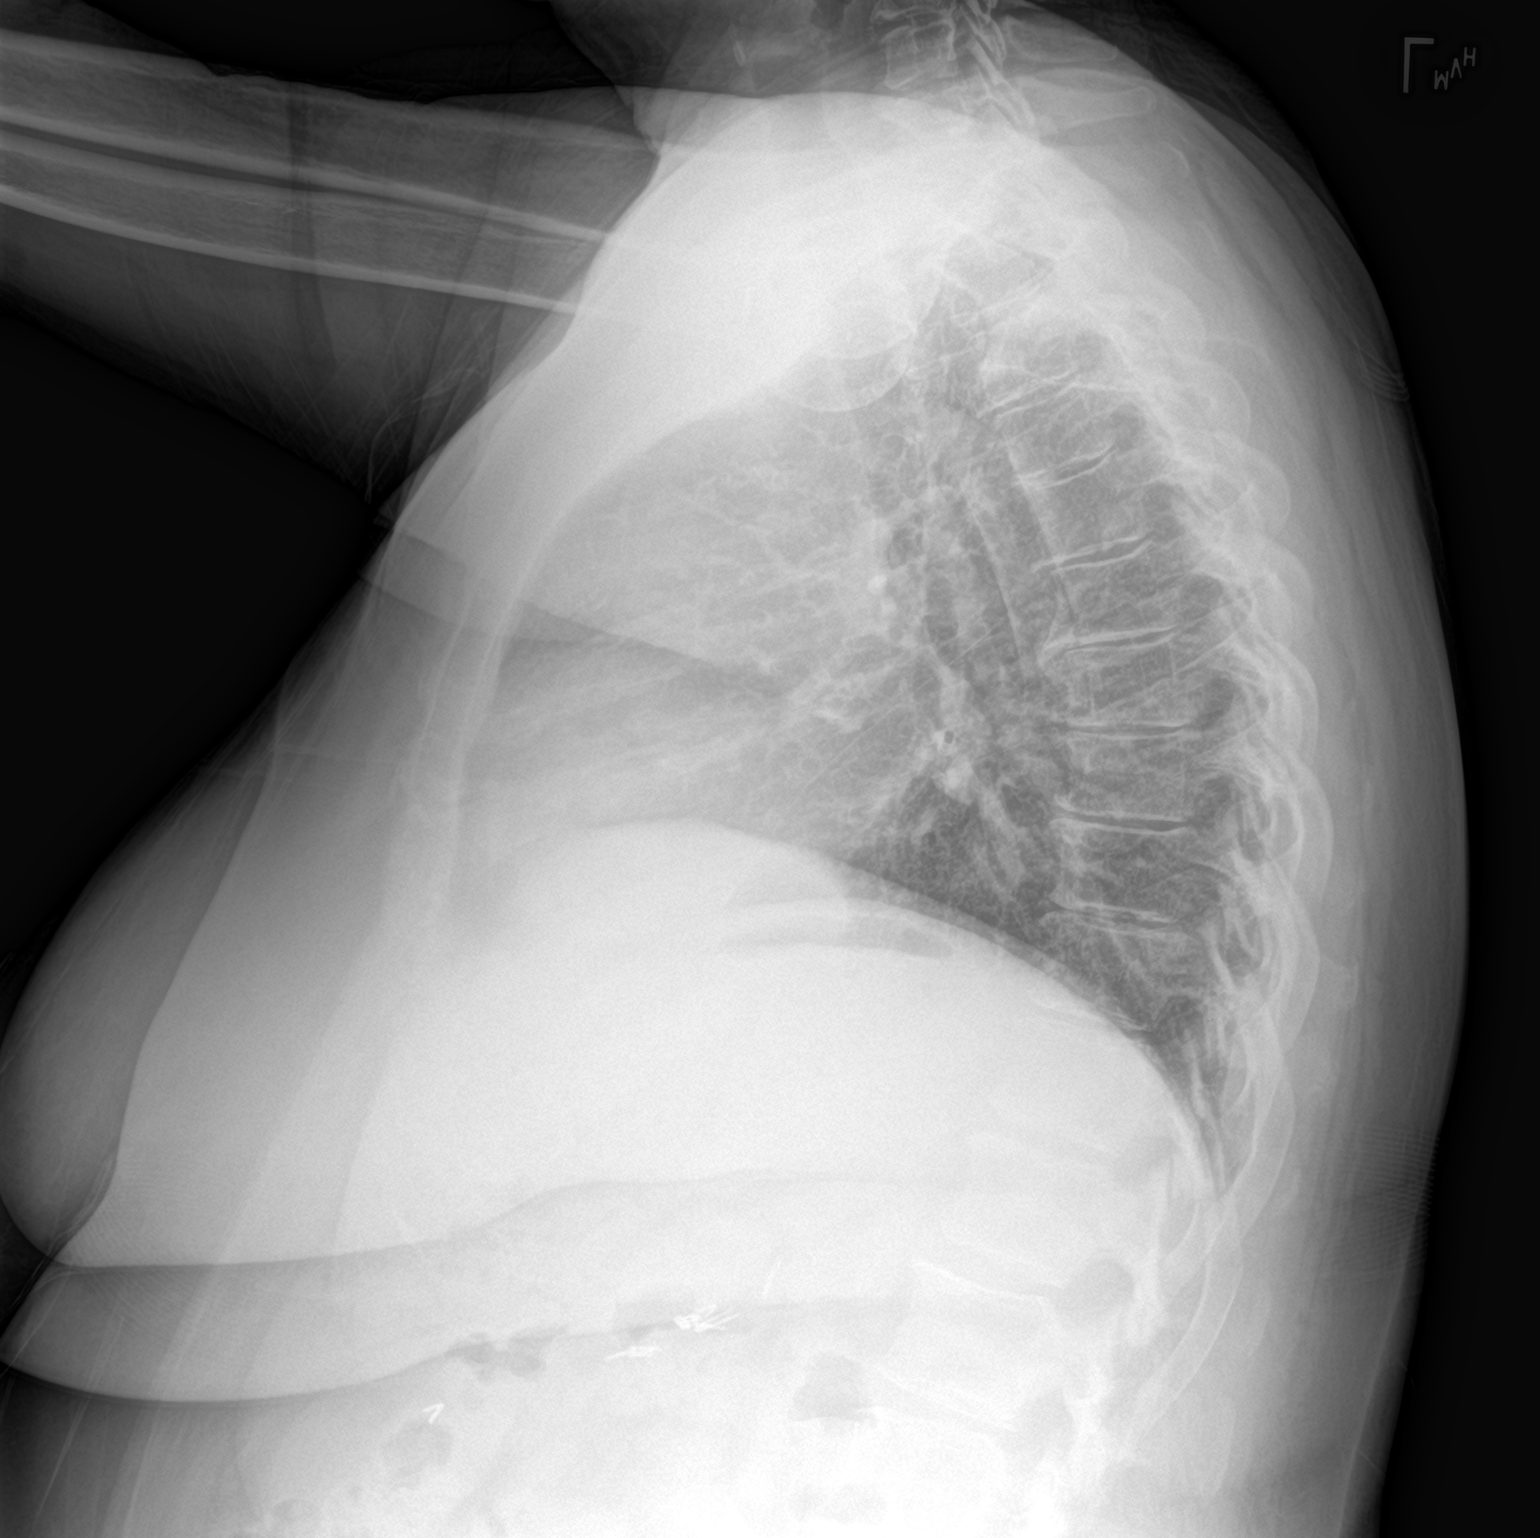

[2 of 2 positions shown; findings below may reference images not displayed]

FINDINGS: Low lung volumes. Cardiac contour at the upper limit of normal.
Mediastinal contours are within normal limits. No focal pulmonary
opacity. No pleural effusion or pneumothorax. No acute osseous
abnormality.
IMPRESSION: No acute cardiopulmonary process.
